# Patient Record
Sex: Female | Born: 1960
Health system: Southern US, Community
[De-identification: ages and names within clinical notes are randomized; demographics above are authoritative.]

## PROBLEM LIST (undated history)

## (undated) HISTORY — PX: ABDOMINAL HYSTERECTOMY: SHX81

---

## 2000-11-20 ENCOUNTER — Encounter (INDEPENDENT_AMBULATORY_CARE_PROVIDER_SITE_OTHER): Payer: Self-pay | Admitting: Specialist

## 2000-11-20 ENCOUNTER — Ambulatory Visit (HOSPITAL_COMMUNITY): Admission: AD | Admit: 2000-11-20 | Discharge: 2000-11-20 | Payer: Self-pay | Admitting: Obstetrics & Gynecology

## 2002-08-19 ENCOUNTER — Inpatient Hospital Stay (HOSPITAL_COMMUNITY): Admission: AD | Admit: 2002-08-19 | Discharge: 2002-08-19 | Payer: Self-pay | Admitting: Obstetrics and Gynecology

## 2003-01-04 ENCOUNTER — Inpatient Hospital Stay (HOSPITAL_COMMUNITY): Admission: AD | Admit: 2003-01-04 | Discharge: 2003-01-08 | Payer: Self-pay | Admitting: Obstetrics & Gynecology

## 2003-01-05 ENCOUNTER — Encounter (INDEPENDENT_AMBULATORY_CARE_PROVIDER_SITE_OTHER): Payer: Self-pay | Admitting: Specialist

## 2003-01-29 ENCOUNTER — Other Ambulatory Visit: Admission: RE | Admit: 2003-01-29 | Discharge: 2003-01-29 | Payer: Self-pay | Admitting: Obstetrics & Gynecology

## 2003-08-05 ENCOUNTER — Emergency Department (HOSPITAL_COMMUNITY): Admission: EM | Admit: 2003-08-05 | Discharge: 2003-08-05 | Payer: Self-pay | Admitting: Emergency Medicine

## 2003-08-19 ENCOUNTER — Encounter: Admission: RE | Admit: 2003-08-19 | Discharge: 2003-10-15 | Payer: Self-pay | Admitting: General Practice

## 2004-07-24 ENCOUNTER — Emergency Department (HOSPITAL_COMMUNITY): Admission: EM | Admit: 2004-07-24 | Discharge: 2004-07-24 | Payer: Self-pay | Admitting: Family Medicine

## 2004-07-26 ENCOUNTER — Encounter: Admission: RE | Admit: 2004-07-26 | Discharge: 2004-07-26 | Payer: Self-pay | Admitting: Occupational Medicine

## 2004-10-28 ENCOUNTER — Other Ambulatory Visit: Admission: RE | Admit: 2004-10-28 | Discharge: 2004-10-28 | Payer: Self-pay | Admitting: *Deleted

## 2006-01-10 ENCOUNTER — Other Ambulatory Visit: Admission: RE | Admit: 2006-01-10 | Discharge: 2006-01-10 | Payer: Self-pay | Admitting: *Deleted

## 2008-02-27 ENCOUNTER — Encounter: Admission: RE | Admit: 2008-02-27 | Discharge: 2008-02-27 | Payer: Self-pay | Admitting: Obstetrics and Gynecology

## 2010-06-23 ENCOUNTER — Encounter
Admission: RE | Admit: 2010-06-23 | Discharge: 2010-08-03 | Payer: Self-pay | Source: Home / Self Care | Admitting: General Practice

## 2010-10-03 ENCOUNTER — Encounter: Payer: Self-pay | Admitting: Obstetrics and Gynecology

## 2011-01-04 ENCOUNTER — Other Ambulatory Visit: Payer: Self-pay | Admitting: Obstetrics and Gynecology

## 2011-01-04 DIAGNOSIS — Z1231 Encounter for screening mammogram for malignant neoplasm of breast: Secondary | ICD-10-CM

## 2011-01-13 ENCOUNTER — Ambulatory Visit (HOSPITAL_COMMUNITY)
Admission: RE | Admit: 2011-01-13 | Discharge: 2011-01-13 | Disposition: A | Discharge status: Home / Self Care | Payer: Managed Care, Other (non HMO) | Source: Ambulatory Visit | Attending: Obstetrics and Gynecology | Admitting: Obstetrics and Gynecology

## 2011-01-13 DIAGNOSIS — Z1231 Encounter for screening mammogram for malignant neoplasm of breast: Secondary | ICD-10-CM

## 2011-01-28 NOTE — Discharge Summary (Signed)
NAMEHAYLE, Kelly Sawyer NO.:  [id number]   MEDICAL RECORD NO.:  [id number]                   PATIENT TYPE:  INP   LOCATION:  9142                                 FACILITY:  [facility]   PHYSICIAN:  Randye Lobo, M.D.                DATE OF BIRTH:  October 20, 1960   DATE OF ADMISSION:  01/04/2003  DATE OF DISCHARGE:  01/08/2003                                 DISCHARGE SUMMARY   FINAL DIAGNOSES:  1. Intrauterine pregnancy at term.  2. Prolonged rupture of membranes.  3. Positive group B strep culture.  4. Nonreassuring fetal heart tracing.   PROCEDURE:  Primary low transverse cesarean section.   SURGEON:  Gerrit Friends. Aldona Bar, M.D.   COMPLICATIONS:  None.   HOSPITAL COURSE:  This 50 year old G6 P2 presented on the evening of January 04, 2003 with ruptured membranes with what sounds like had occurred about 17  hours ago.  The patient was having minimal contractions.  Her antepartum  course had been complicated by advanced maternal age.  She did decline  amniocentesis.  The patient also had a positive group B strep culture  performed in the office.  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  The patient of course was admitted at this time.  Her  cervix was about 1 cm dilated at a -3 station.  She was begun on IV Unasyn  and induction was begun.  The patient dilated to about 4 cm rapidly but then  for over the next five to six hours only progressed to about 7 cm and -2  station.  Fetal heart tracing became nonreassuring and the patient was  placed on oxygen and position change.  Because of this nonreassuring tracing  and prolonged rupture of membranes the patient was taken to the operating  room for a cesarean section.   The patient was taken to the operating room by Dr. Annamaria Helling on January 05, 2003 where a primary low transverse cesarean section was performed  with the  delivery of a 6 pound 10 ounce female infant with Apgars of 9 and 9.  There  was a body cord noted.  The delivery went without complications.  The  patient's postoperative course was benign without any significant fevers.  The patient was felt ready for discharge on postoperative day #3.   DISPOSITION:  1. She was sent home on a regular diet.  2. Told to decrease activity.  3. Given a prescription for Percocet one to two q.4h. as needed for pain.  4. Told she could use ibuprofen if needed.  5.     Told to continue her prenatal vitamins and iron sulfate 325 mg one daily.  6. Follow up in the office in four weeks.  LABORATORY DATA ON DISCHARGE:  Hemoglobin 10.3, white blood cell count 8.6.     Leilani Able, P.A.-C.                Randye Lobo, M.D.    MB/MEDQ  D:  02/06/2003  T:  02/06/2003  Job:  [id number]

## 2011-01-28 NOTE — Op Note (Signed)
Kelly Sawyer, Kelly Sawyer                              ACCOUNT NO.:  [id number]   MEDICAL RECORD NO.:  [id number]                   PATIENT TYPE:  INP   LOCATION:  9142                                 FACILITY:  [facility]   PHYSICIAN:  Gerrit Friends. Aldona Bar, M.D.                DATE OF BIRTH:  [birth year]   DATE OF PROCEDURE:  01/05/2003  DATE OF DISCHARGE:                                 OPERATIVE REPORT   PREOPERATIVE DIAGNOSES:  1. Term pregnancy.  2. Prolonged rupture of membranes.  3. Positive group B strep.  4. Nonreassuring fetal heart tracing.   POSTOPERATIVE DIAGNOSES:  1. Term pregnancy.  2. Prolonged rupture of membranes.  3. Positive group B strep.  4. Nonreassuring fetal heart tracing.  5. Delivery of 6 pound 10 ounce female infant, Apgars 9 and 9 and body cord.   PROCEDURE:  Primary low transverse cesarean section.   SURGEON:  Gerrit Friends. Aldona Bar, M.D.   ANESTHESIA:  Epidural.   INDICATIONS:  This 50 year old gravida 6, para 2 presented at approximately  9:30 p.m. on the evening of January 04, 2003, having called me at 7 p.m.  relating what sounded like ruptured membranes.  Her history revealed that  she woke up at 5 a.m. on January 03, 2002, with clear fluid leaking from the  vagina.  At the time of admission, she had ruptured membranes for 17 hours.  She was having minimal contractions.  She did have a positive group B strep  antenatally.  At the time of admission, her cervix was 1 cm dilated, very  thick with the vertex at -3 station.  She was admitted, begun on IV Unasyn,  and induction was begun.  During the night, she had some progression;  initially she progressed rapidly to 4 cm, but thereafter the course of the  next 5-6 hours at best progressed only to 7 with the vertex remaining at -2  station.  Fetal heart tracing became Nonreassuring with the emergence of  decelerations that were initially thought to be variables and more recently  possible variables with late components.  She  was placed on oxygen,  positioned with really no essential improvement and her Pitocin which was  being used for augmentation had been turned down because of the possible  decelerations.  Because of the Nonreassuring tracing, she is now taken to  the operating room for cesarean section for delivery.   DESCRIPTION OF PROCEDURE:  The patient was taken to the operating room where  after the satisfactory augmentation of her epidural, she was prepped and  draped.  A Foley catheter had been inserted prior in the labor room.   Once good anesthetic levels were documented, the procedure was begun.   A Pfannenstiel incision was made with minimal difficulty, and dissected down  to and through the fascia in a low transverse fashion.  Subfascial space was  created  inferiorly and superiorly and muscles separated in the midline.  The  peritoneum was identified and entered appropriately with care taken to avoid  the bowel superiorly and the bladder inferiorly.  At this time, the  vesicouterine peritoneum was incised in a low transverse fashion and pushed  off the lower uterine segment with ease.  Sharp incision to the uterus with  the Metzenbaum scissors was then carried out.  Incision was extended  laterally and thereafter with minimal difficulty a viable female infant who  cried spontaneously was at once was delivered from the vertex position.  There was a body cord.   Once the infant was delivered, the cord was clamped and cut and the infant  was passed off to the awaiting team. Apgars were noted to be 9 and 9.  The  weight was found to be 6 pounds 10 ounces.  The baby was taken to the  nursery in good condition.   After the cord bloods were collected, the placenta was delivered intact.  The placenta was sent to pathology and labeled prolonged ruptured membranes.   At this time, the uterus was exteriorized and rendered free of any remaining  products of conception.  Good uterine contractility was  afforded with slowly  given intravenous Pitocin and manual stimulation.  Closure of the uterine  incision was then carried out at this time with suture of #1 Vicryl in a  running locking fashion with several figure-of-eight #1 Vicryl oversewn for  hemostasis.  At this time, the incision was noted to be dry.  The uterus was  well contracted.  Tubes and ovaries appeared normal. The abdomen was lavaged  of all free blood and clot.  The uterus was placed in the abdominal cavity  and after all counts were rendered to be correct and no foreign bodies were  found to be remaining in the abdominal cavity, closure of the abdomen was  begun in layers.  The abdominal peritoneum was closed with 0 Vicryl in a  running fashion.  Muscles were repaired with same.   Assured of good fascial hemostasis, the fascia was then reapproximated using  0 Vicryl from angle to midline bilaterally.  Subcutaneous tissues were then  rendered hemostatic, and staples were then used to close the skin.  A  sterile pressure dressing was applied.  At this time, the transported to the  recovery room in satisfactory condition having tolerated the procedure well.  Estimated blood loss 500 cc.  All counts were correct x2.   SUMMARY:  In summary, this patient presented with prolonged rupture of  membranes with positive group B strep test antenatally and had some  progression as the night progressed but unfortunately developed a  Nonreassuring tracing and was taken to the operating room for primary low  transverse cesarean section.  She did receive 2 doses of Unasyn prior to  delivery.  In the operating room, she was delivered of a 6 pound 10 ounce  female infant with Apgars of 9 and 9 and a body cord was noted.  At the  conclusion of the procedure, both mother and baby were doing well in their  respective recovery areas.                                               Gerrit Friends. Aldona Bar, M.D.   RMW/MEDQ  D:  01/05/2003  T:   01/05/2003  Job:  [id number]

## 2011-01-28 NOTE — Op Note (Signed)
[facility]  Patient:    Kelly Sawyer, Kelly Sawyer Visit Number: [id number] MRN: [id number]          Service Type: OBS Location: [facility] Attending Physician:  Lars Pinks Proc. Date: 11/20/00 Admit Date:  11/20/2000                             Operative Report  PREOPERATIVE DIAGNOSIS:       Incomplete abortion.  POSTOPERATIVE DIAGNOSIS:      Incomplete abortion.  PROCEDURE:                    Dilatation and evacuation.  SURGEON:                      Richard D. Arlyce Dice, M.D.  ANESTHESIA:                   Paracervical block with IV sedation.  ESTIMATED BLOOD LOSS:         Approximately 200-300 cc from the time the patient presented to the hospital to the time the D&E was performed.  COMPLICATIONS:                None.  FINDINGS:                     The patient passed a 13 week fetus in the emergency room.  She then had the placenta removed with a ring forceps in the operating room.  INDICATIONS:                  This is a 50 year old gravida 5, para 2-1-1-2 who was brought to the hospital by ambulance with heavy vaginal bleeding. On evaluation in the emergency room, a pessary which had been placed in the office was removed and a 13 week fetus was noted to be in the vagina.  THis was also removed.  The patient had significant hemorrhage and, because of hospital regulation, no attempt at placental removal could be made in the emergency room.  The patient was therefore transported to the operating room, for the D&E procedure.  DESCRIPTION OF PROCEDURE:     The patient was taken to the operating room and placed in the dorsal lithotomy position.  Intravenous sedation was administered.  The anterior lip of the cervix was grasped with a single tooth tenaculum and the paracervical tissues were infiltrated with 1% lidocaine, 20 cc.  Ring forceps were introduced.  The placenta was grasped and extracted through the widely dilated cervix.  A #12 suction  curet was introduced and the endometrium was systematically curetted until no further tissue was noted to be in situ.  The procedure was then terminated and the patient left the operating room in good condition. Attending Physician:  Lars Pinks DD:  11/20/00 TD:  11/21/00 Job: [id number]

## 2012-04-02 ENCOUNTER — Emergency Department (HOSPITAL_COMMUNITY)
Admission: EM | Admit: 2012-04-02 | Discharge: 2012-04-02 | Disposition: A | Discharge status: Home / Self Care | Payer: Managed Care, Other (non HMO) | Attending: Emergency Medicine | Admitting: Emergency Medicine

## 2012-04-02 ENCOUNTER — Emergency Department (HOSPITAL_COMMUNITY): Payer: Managed Care, Other (non HMO)

## 2012-04-02 ENCOUNTER — Encounter (HOSPITAL_COMMUNITY): Payer: Self-pay | Admitting: *Deleted

## 2012-04-02 DIAGNOSIS — R51 Headache: Secondary | ICD-10-CM | POA: Insufficient documentation

## 2012-04-02 DIAGNOSIS — R141 Gas pain: Secondary | ICD-10-CM

## 2012-04-02 DIAGNOSIS — R110 Nausea: Secondary | ICD-10-CM | POA: Insufficient documentation

## 2012-04-02 DIAGNOSIS — R142 Eructation: Secondary | ICD-10-CM | POA: Insufficient documentation

## 2012-04-02 DIAGNOSIS — R140 Abdominal distension (gaseous): Secondary | ICD-10-CM

## 2012-04-02 LAB — COMPREHENSIVE METABOLIC PANEL
ALT: 25 U/L (ref 0–35)
AST: 33 U/L (ref 0–37)
Albumin: 4.4 g/dL (ref 3.5–5.2)
Alkaline Phosphatase: 59 U/L (ref 39–117)
BUN: 11 mg/dL (ref 6–23)
CO2: 28 mEq/L (ref 19–32)
Calcium: 9.6 mg/dL (ref 8.4–10.5)
Chloride: 104 mEq/L (ref 96–112)
Creatinine, Ser: 0.86 mg/dL (ref 0.50–1.10)
GFR calc Af Amer: 90 mL/min — ABNORMAL LOW (ref >90)
GFR calc non Af Amer: 77 mL/min — ABNORMAL LOW (ref >90)
Glucose, Bld: 98 mg/dL (ref 70–99)
Potassium: 4.2 mEq/L (ref 3.5–5.1)
Sodium: 141 mEq/L (ref 135–145)
Total Bilirubin: 0.3 mg/dL (ref 0.3–1.2)
Total Protein: 7.7 g/dL (ref 6.0–8.3)

## 2012-04-02 LAB — OCCULT BLOOD, POC DEVICE: Fecal Occult Bld: NEGATIVE

## 2012-04-02 LAB — URINALYSIS, ROUTINE W REFLEX MICROSCOPIC
Bilirubin Urine: NEGATIVE
Glucose, UA: NEGATIVE mg/dL
Hgb urine dipstick: NEGATIVE
Ketones, ur: NEGATIVE mg/dL
Nitrite: NEGATIVE
Protein, ur: NEGATIVE mg/dL
Specific Gravity, Urine: 1.021 (ref 1.005–1.030)
Urobilinogen, UA: 0.2 mg/dL (ref 0.0–1.0)
pH: 8 (ref 5.0–8.0)

## 2012-04-02 LAB — URINE MICROSCOPIC-ADD ON

## 2012-04-02 LAB — CBC WITH DIFFERENTIAL/PLATELET
Basophils Absolute: 0 10*3/uL (ref 0.0–0.1)
Basophils Relative: 0 % (ref 0–1)
Eosinophils Absolute: 0.1 10*3/uL (ref 0.0–0.7)
Eosinophils Relative: 2 % (ref 0–5)
HCT: 36.9 % (ref 36.0–46.0)
Hemoglobin: 12.4 g/dL (ref 12.0–15.0)
Lymphocytes Relative: 36 % (ref 12–46)
Lymphs Abs: 1.7 10*3/uL (ref 0.7–4.0)
MCH: 30.7 pg (ref 26.0–34.0)
MCHC: 33.6 g/dL (ref 30.0–36.0)
MCV: 91.3 fL (ref 78.0–100.0)
Monocytes Absolute: 0.3 10*3/uL (ref 0.1–1.0)
Monocytes Relative: 7 % (ref 3–12)
Neutro Abs: 2.6 10*3/uL (ref 1.7–7.7)
Neutrophils Relative %: 56 % (ref 43–77)
Platelets: 243 10*3/uL (ref 150–400)
RBC: 4.04 MIL/uL (ref 3.87–5.11)
RDW: 13.1 % (ref 11.5–15.5)
WBC: 4.8 10*3/uL (ref 4.0–10.5)

## 2012-04-02 MED ORDER — DICYCLOMINE HCL 10 MG PO CAPS
10.0000 mg | ORAL_CAPSULE | Freq: Once | ORAL | Status: AC
  Administered 2012-04-02: 10 mg via ORAL
  Filled 2012-04-02: qty 1

## 2012-04-02 MED ORDER — SIMETHICONE 40 MG/0.6ML PO SUSP: 40.0000 mg | Freq: Four times a day (QID) | ORAL | Status: DC | PRN

## 2012-04-02 MED ORDER — ALUM & MAG HYDROXIDE-SIMETH 200-200-20 MG/5ML PO SUSP
30.0000 mL | Freq: Once | ORAL | Status: AC
  Administered 2012-04-02: 30 mL via ORAL
  Filled 2012-04-02: qty 30

## 2012-04-02 NOTE — ED Provider Notes (Signed)
History     CSN: [id number]  Arrival date & time 04/02/12  1807   First MD Initiated Contact with Patient 04/02/12 2030      Chief Complaint  Patient presents with  . Abdominal Pain    (Consider location/radiation/quality/duration/timing/severity/associated sxs/prior treatment) Patient is a 51 y.o. female presenting with abdominal pain. The history is provided by the patient.  Abdominal Pain The primary symptoms of the illness include abdominal pain and nausea. The primary symptoms of the illness do not include fever, fatigue, shortness of breath, vomiting, diarrhea, hematemesis, hematochezia, dysuria or vaginal discharge. Episode onset: one week ago. The onset of the illness was gradual. The problem has not changed since onset. The abdominal pain is generalized. The abdominal pain does not radiate. Relieved by: nothing. Exacerbated by: nothing.  Additional symptoms associated with the illness include constipation. Symptoms associated with the illness do not include chills, diaphoresis, hematuria or back pain. Associated medical issues comments: none.    History reviewed. No pertinent past medical history.  History reviewed. No pertinent past surgical history.  History reviewed. No pertinent family history.  History  Substance Use Topics  . Smoking status: Never Smoker   . Smokeless tobacco: Not on file  . Alcohol Use: No    OB History    Grav Para Term Preterm Abortions TAB SAB Ect Mult Living                  Review of Systems  Constitutional: Negative for fever, chills, diaphoresis and fatigue.  HENT: Negative for ear pain, congestion, sore throat, facial swelling, mouth sores, trouble swallowing, neck pain and neck stiffness.   Eyes: Negative.   Respiratory: Negative for apnea, cough, chest tightness, shortness of breath and wheezing.   Cardiovascular: Negative for chest pain, palpitations and leg swelling.  Gastrointestinal: Positive for nausea, abdominal pain and  constipation. Negative for vomiting, diarrhea, hematochezia, abdominal distention, anal bleeding and hematemesis.  Genitourinary: Negative for dysuria, hematuria, flank pain, vaginal discharge, difficulty urinating and menstrual problem.  Musculoskeletal: Negative for back pain and gait problem.  Skin: Negative for rash and wound.  Neurological: Positive for headaches. Negative for dizziness, tremors, seizures, syncope, facial asymmetry and numbness.  Psychiatric/Behavioral: Negative.   All other systems reviewed and are negative.    Allergies  Review of patient's allergies indicates no known allergies.  Home Medications   Current Outpatient Rx  Name Route Sig Dispense Refill  . ESOMEPRAZOLE MAGNESIUM 40 MG PO CPDR Oral Take 40 mg by mouth daily before breakfast.    . MAGNESIUM HYDROXIDE 400 MG/5ML PO SUSP Oral Take 30 mLs by mouth daily as needed. For stomach pain    . NAPROXEN SODIUM 220 MG PO TABS Oral Take 440 mg by mouth daily as needed. For pain    . SIMETHICONE 40 MG/0.6ML PO SUSP Oral Take 0.6 mLs (40 mg total) by mouth 4 (four) times daily as needed. 30 mL 0    BP 112/53  Pulse 68  Temp 99.1 F (37.3 C)  Resp 20  SpO2 98%  Physical Exam  Nursing note and vitals reviewed. Constitutional: She is oriented to person, place, and time. She appears well-developed and well-nourished. No distress.  HENT:  Head: Normocephalic and atraumatic.  Right Ear: External ear normal.  Left Ear: External ear normal.  Nose: Nose normal.  Mouth/Throat: Oropharynx is clear and moist. No oropharyngeal exudate.  Eyes: Conjunctivae and EOM are normal. Pupils are equal, round, and reactive to light. Right eye exhibits no  discharge. Left eye exhibits no discharge.  Neck: Normal range of motion. Neck supple. No JVD present. No tracheal deviation present. No thyromegaly present.  Cardiovascular: Normal rate, regular rhythm, normal heart sounds and intact distal pulses.  Exam reveals no gallop and  no friction rub.   No murmur heard. Pulmonary/Chest: Effort normal and breath sounds normal. No respiratory distress. She has no wheezes. She has no rales. She exhibits no tenderness.  Abdominal: Soft. Bowel sounds are normal. She exhibits no distension. There is tenderness (diffuse mild abdominal discomfort). There is no rebound and no guarding.  Genitourinary: Rectum normal. Rectal exam shows no external hemorrhoid, no internal hemorrhoid, no fissure, no mass, no tenderness and anal tone normal. Guaiac negative stool.  Musculoskeletal: Normal range of motion.  Lymphadenopathy:    She has no cervical adenopathy.  Neurological: She is alert and oriented to person, place, and time. No cranial nerve deficit. Coordination normal.  Skin: Skin is warm. No rash noted. She is not diaphoretic.  Psychiatric: She has a normal mood and affect. Her behavior is normal. Judgment and thought content normal.    ED Course  Procedures (including critical care time)  Labs Reviewed  URINALYSIS, ROUTINE W REFLEX MICROSCOPIC - Abnormal; Notable for the following:    APPearance CLOUDY (*)     Leukocytes, UA TRACE (*)     All other components within normal limits  COMPREHENSIVE METABOLIC PANEL - Abnormal; Notable for the following:    GFR calc non Af Amer 77 (*)     GFR calc Af Amer 90 (*)     All other components within normal limits  URINE MICROSCOPIC-ADD ON - Abnormal; Notable for the following:    Squamous Epithelial / LPF MANY (*)     All other components within normal limits  CBC WITH DIFFERENTIAL  OCCULT BLOOD, POC DEVICE  OCCULT BLOOD X 1 CARD TO LAB, STOOL   Dg Abd 1 View  04/02/2012  *RADIOLOGY REPORT*  Clinical Data: Abdominal pain  ABDOMEN - 1 VIEW  Comparison: CT 10/15/2007  Findings: Stool is present in the right colon.  Negative for bowel obstruction.  No kidney stones.  No acute bony abnormality.  IMPRESSION: No acute abnormality.  Original Report Authenticated By: Camelia Phenes, M.D.      1. Abdominal bloating   2. Gas pain       MDM  51 year old female patient with noncontributory past medical history presents with abdominal bloating sensation and discomfort. Patient noted that she had headaches with nurse but did not say that she's been having headaches when I asked her. She says that she sometimes has headaches but is not currently having one. Patient says her abdominal symptoms started one week ago and has been feeling nauseated with an intermittently. She says she has diffuse abdominal discomfort that migrates and feels like gas is moving in her stomach. Patient says she felt she was constipated took some milk of magnesia and had a normal bowel movement today. Patient has no pain with eating drinking. Nothing makes the pain better or worse according to the patient. Patient with normal rectal exam nonfocal abdominal exam was soft abdomen with deep palpation. Patient without vaginal bleeding vaginal discharge or pelvic pain. No dysuria. Labs are normal no evidence of ileus on x-ray. Patient without focal concerning abdominal pain, I suspect that she may have pain related to gas. Gave patient simethicone and Maalox with resolution of symptoms. Wrote prescription for simethicone and we'll have patient followup with PCP.  Results for orders placed during the hospital encounter of 04/02/12  URINALYSIS, ROUTINE W REFLEX MICROSCOPIC      Component Value Range   Color, Urine YELLOW  YELLOW   APPearance CLOUDY (*) CLEAR   Specific Gravity, Urine 1.021  1.005 - 1.030   pH 8.0  5.0 - 8.0   Glucose, UA NEGATIVE  NEGATIVE mg/dL   Hgb urine dipstick NEGATIVE  NEGATIVE   Bilirubin Urine NEGATIVE  NEGATIVE   Ketones, ur NEGATIVE  NEGATIVE mg/dL   Protein, ur NEGATIVE  NEGATIVE mg/dL   Urobilinogen, UA 0.2  0.0 - 1.0 mg/dL   Nitrite NEGATIVE  NEGATIVE   Leukocytes, UA TRACE (*) NEGATIVE  CBC WITH DIFFERENTIAL      Component Value Range   WBC 4.8  4.0 - 10.5 K/uL   RBC 4.04  3.87  - 5.11 MIL/uL   Hemoglobin 12.4  12.0 - 15.0 g/dL   HCT 16.1  09.6 - 04.5 %   MCV 91.3  78.0 - 100.0 fL   MCH 30.7  26.0 - 34.0 pg   MCHC 33.6  30.0 - 36.0 g/dL   RDW 40.9  81.1 - 91.4 %   Platelets 243  150 - 400 K/uL   Neutrophils Relative 56  43 - 77 %   Neutro Abs 2.6  1.7 - 7.7 K/uL   Lymphocytes Relative 36  12 - 46 %   Lymphs Abs 1.7  0.7 - 4.0 K/uL   Monocytes Relative 7  3 - 12 %   Monocytes Absolute 0.3  0.1 - 1.0 K/uL   Eosinophils Relative 2  0 - 5 %   Eosinophils Absolute 0.1  0.0 - 0.7 K/uL   Basophils Relative 0  0 - 1 %   Basophils Absolute 0.0  0.0 - 0.1 K/uL  COMPREHENSIVE METABOLIC PANEL      Component Value Range   Sodium 141  135 - 145 mEq/L   Potassium 4.2  3.5 - 5.1 mEq/L   Chloride 104  96 - 112 mEq/L   CO2 28  19 - 32 mEq/L   Glucose, Bld 98  70 - 99 mg/dL   BUN 11  6 - 23 mg/dL   Creatinine, Ser 7.82  0.50 - 1.10 mg/dL   Calcium 9.6  8.4 - 95.6 mg/dL   Total Protein 7.7  6.0 - 8.3 g/dL   Albumin 4.4  3.5 - 5.2 g/dL   AST 33  0 - 37 U/L   ALT 25  0 - 35 U/L   Alkaline Phosphatase 59  39 - 117 U/L   Total Bilirubin 0.3  0.3 - 1.2 mg/dL   GFR calc non Af Amer 77 (*) >90 mL/min   GFR calc Af Amer 90 (*) >90 mL/min  URINE MICROSCOPIC-ADD ON      Component Value Range   Squamous Epithelial / LPF MANY (*) RARE   WBC, UA 0-2  <3 WBC/hpf   RBC / HPF 0-2  <3 RBC/hpf   Urine-Other AMORPHOUS URATES/PHOSPHATES    OCCULT BLOOD, POC DEVICE      Component Value Range   Fecal Occult Bld NEGATIVE      DG Abd 1 View (Final result)   Result time:04/02/12 2147    Final result by Rad Results In Interface (04/02/12 21:47:56)    Narrative:   *RADIOLOGY REPORT*  Clinical Data: Abdominal pain  ABDOMEN - 1 VIEW  Comparison: CT 10/15/2007  Findings: Stool is present in the right colon. Negative for  bowel obstruction. No kidney stones. No acute bony abnormality.  IMPRESSION: No acute abnormality.  Original Report Authenticated By: Camelia Phenes, M.D.      Case discussed with Dr. Sharlot Gowda, MD 04/02/12 [phone]

## 2012-04-02 NOTE — ED Notes (Signed)
The pt ha shad abd pain for one week with nausea and vomiting headache and bloating.  lmp none

## 2012-04-02 NOTE — ED Notes (Signed)
Patient is resting comfortably. 

## 2012-04-06 NOTE — ED Provider Notes (Signed)
I saw and evaluated the patient, reviewed the resident's note and I agree with the findings and plan.  50yF with abdominal pain. Mi;d diffuse tenderness on my exam without rebound or guarding. No distension. W/u reassuring. Doubt obstruction and XR not suggestive either. Very low suspicion for acute surgical abdomen. Plan symptomatic tx. Return precautions discussed. Outtpt fu.  Raeford Razor, MD 04/06/12 [phone]

## 2012-06-24 ENCOUNTER — Emergency Department (HOSPITAL_BASED_OUTPATIENT_CLINIC_OR_DEPARTMENT_OTHER): Payer: Managed Care, Other (non HMO)

## 2012-06-24 ENCOUNTER — Encounter (HOSPITAL_BASED_OUTPATIENT_CLINIC_OR_DEPARTMENT_OTHER): Payer: Self-pay | Admitting: *Deleted

## 2012-06-24 ENCOUNTER — Emergency Department (HOSPITAL_BASED_OUTPATIENT_CLINIC_OR_DEPARTMENT_OTHER)
Admission: EM | Admit: 2012-06-24 | Discharge: 2012-06-25 | Disposition: A | Discharge status: Home / Self Care | Payer: Managed Care, Other (non HMO) | Attending: Emergency Medicine | Admitting: Emergency Medicine

## 2012-06-24 DIAGNOSIS — Z79899 Other long term (current) drug therapy: Secondary | ICD-10-CM | POA: Insufficient documentation

## 2012-06-24 DIAGNOSIS — R079 Chest pain, unspecified: Secondary | ICD-10-CM

## 2012-06-24 DIAGNOSIS — R51 Headache: Secondary | ICD-10-CM

## 2012-06-24 DIAGNOSIS — R0602 Shortness of breath: Secondary | ICD-10-CM | POA: Insufficient documentation

## 2012-06-24 LAB — URINALYSIS, ROUTINE W REFLEX MICROSCOPIC
Glucose, UA: NEGATIVE mg/dL
Hgb urine dipstick: NEGATIVE
Ketones, ur: NEGATIVE mg/dL
Protein, ur: NEGATIVE mg/dL
pH: 7 (ref 5.0–8.0)

## 2012-06-24 MED ORDER — SODIUM CHLORIDE 0.9 % IV BOLUS (SEPSIS)
1000.0000 mL | Freq: Once | INTRAVENOUS | Status: AC
  Administered 2012-06-24: 1000 mL via INTRAVENOUS

## 2012-06-24 NOTE — ED Provider Notes (Signed)
History     CSN: [id number]  Arrival date & time 06/24/12  2200   First MD Initiated Contact with Patient 06/24/12 2301      Chief Complaint  Patient presents with  . Chest Pain     HPI  The patient p/w HA and CP.  She was in her [facility] prior to ~5d pta, and has no Hx of headaches, nor any similar episodes to that which brings her to the ED tonight. For the past 5d she has had intermittent CP (sternal / l-sided, non-radiating, non-exertional, non-pleuritic, sharp).  There are no clear alleviating / exacerbating /precipitating factors.  No associated dyspnea, n/v/d. For the past 2d she has been developing a HA. There is a sense of pressure in the retro-orbital area, with photophobia, but no acuity changes.  She denies confusion / disorientation, but does endorse feeling generally unwell.  She has new insomnia as well, 2/2 the pain.  History reviewed. No pertinent past medical history.  Past Surgical History  Procedure Date  . Abdominal hysterectomy     No family history on file.  History  Substance Use Topics  . Smoking status: Never Smoker   . Smokeless tobacco: Not on file  . Alcohol Use: No    OB History    Grav Para Term Preterm Abortions TAB SAB Ect Mult Living                  Review of Systems  Constitutional:       HPI  HENT:       HPI otherwise negative  Eyes: Negative.   Respiratory:       HPI, otherwise negative  Cardiovascular:       HPI, otherwise nmegative  Gastrointestinal: Negative for vomiting.  Genitourinary:       HPI, otherwise negative  Musculoskeletal:       HPI, otherwise negative  Skin: Negative.   Neurological: Positive for headaches. Negative for syncope.    Allergies  Review of patient's allergies indicates no known allergies.  Home Medications   Current Outpatient Rx  Name Route Sig Dispense Refill  . TRAZODONE HCL 100 MG PO TABS Oral Take 100 mg by mouth as needed.    Marland Kitchen ESOMEPRAZOLE MAGNESIUM 40 MG PO CPDR Oral Take 40 mg  by mouth daily before breakfast.      BP 113/69  Pulse 62  Temp 98.1 F (36.7 C) (Oral)  Resp 16  Ht 5\' 1"  (1.549 m)  Wt 145 lb (65.772 kg)  BMI 27.40 kg/m2  SpO2 100%  Physical Exam  Nursing note and vitals reviewed. Constitutional: She is oriented to person, place, and time. She appears well-developed and well-nourished. No distress.  HENT:  Head: Normocephalic and atraumatic.  Eyes: Conjunctivae normal and EOM are normal. Right eye exhibits no discharge. Left eye exhibits no discharge.       Patient will not fully open her eyes, but pupils are symmetric.  Trace horizontal nystagmus, no vertical.  EOMI  Cardiovascular: Normal rate and regular rhythm.   Pulmonary/Chest: Effort normal and breath sounds normal. No stridor. No respiratory distress.  Abdominal: She exhibits no distension.  Musculoskeletal: She exhibits no edema.  Neurological: She is alert and oriented to person, place, and time. No cranial nerve deficit. Coordination normal.       Seemingly symmetric strength and coordination, but slowed responses, and slow to follow commands.  Skin: Skin is warm and dry.  Psychiatric: She has a normal mood and affect. Judgment  and thought content normal. Her speech is delayed. She is withdrawn. Cognition and memory are normal.    ED Course  Procedures (including critical care time)   Labs Reviewed  CBC WITH DIFFERENTIAL  COMPREHENSIVE METABOLIC PANEL  TROPONIN I  URINALYSIS, ROUTINE W REFLEX MICROSCOPIC   No results found.   No diagnosis found.  Cardiac: 61sr, normal  O2:99%ra, normal   Date: 06/24/2012  Rate: 59  Rhythm: sinus bradycardia  QRS Axis: normal  Intervals: normal  ST/T Wave abnormalities: normal  Conduction Disutrbances:none  Narrative Interpretation:   Old EKG Reviewed: none available BORDERLINE  12:42 AM Labs and imaging reviewed with the patient.  She reports continued retro-orbital discomfort.  On repeat exam there is trace bilateral  injected conjunctiva.  She will receive additional analgesics and be reassessed.  2:13 AM The patient was feeling significantly better.  On reexam she is resting comfortably, in no distress.  Vital signs remain unremarkable. MDM  This generally well-appearing female presents with concerns of headache and chest pain.  Given the patient's description of symptoms that progressed over the past days there suspicion of infectious etiology versus complex migraine, with low concern for ongoing acute ischemic cardiac issue.  The patient's relative comfort during my exam and on repeat exams is reassuring for this likelihood.  Absent focal neuro findings, there is low suspicion for bleed or CVA.  The patient's description of retro-orbital pain, but with appropriate extraocular eye motion, and visual acuity is suggestive of atypical migraine as well.  Absent fever, nausea, vomiting, overt signs of distress or leukocytosis infectious etiology is less likely.  The patient did have mild elevation in her creatinine.  Following IV fluids, analgesics the patient was significantly better.  I discussed all findings with the patient.  Also discussed the need for ongoing primary care evaluation with repeat blood draw.  She was discharged in stable condition.  Gerhard Munch, MD 06/25/12 [phone]

## 2012-06-24 NOTE — ED Notes (Addendum)
Pt. Has multiple complaints. States she is not able to open her eyes due to pain. Denies any injury.  On exam pt. Will not open eyes for assessment. Did attempt to look into bilateral eyes at pupils and sclera with mild redness to both eyes States pain started today. Denies any h/a  Also, c/o of ant chest pain that started yesterday. Describes as dull pain that comes and goes. C/o feeling sob as well. Rates current cp 6/10. Denies n/v or fevers. Denies any hx of cp. resp even and unlabored.

## 2012-06-24 NOTE — ED Notes (Signed)
Patient transported to X-ray 

## 2012-06-25 LAB — CBC WITH DIFFERENTIAL/PLATELET
Eosinophils Relative: 4 % (ref 0–5)
Lymphocytes Relative: 28 % (ref 12–46)
Monocytes Relative: 9 % (ref 3–12)
Neutrophils Relative %: 59 % (ref 43–77)
Platelets: UNDETERMINED 10*3/uL (ref 150–400)
RBC: 4.18 MIL/uL (ref 3.87–5.11)
WBC: 6.5 10*3/uL (ref 4.0–10.5)

## 2012-06-25 LAB — COMPREHENSIVE METABOLIC PANEL
ALT: 26 U/L (ref 0–35)
AST: 23 U/L (ref 0–37)
CO2: 27 mEq/L (ref 19–32)
Chloride: 102 mEq/L (ref 96–112)
GFR calc non Af Amer: 51 mL/min — ABNORMAL LOW (ref >90)
Sodium: 138 mEq/L (ref 135–145)
Total Bilirubin: 0.4 mg/dL (ref 0.3–1.2)

## 2012-06-25 MED ORDER — SODIUM CHLORIDE 0.9 % IV BOLUS (SEPSIS)
1000.0000 mL | Freq: Once | INTRAVENOUS | Status: AC
  Administered 2012-06-25: 1000 mL via INTRAVENOUS

## 2012-06-25 MED ORDER — ACETAMINOPHEN 325 MG PO TABS
ORAL_TABLET | ORAL | Status: AC
  Administered 2012-06-25: 650 mg via ORAL
  Filled 2012-06-25: qty 2

## 2012-06-25 MED ORDER — ACETAMINOPHEN 500 MG PO TABS: 500.0000 mg | ORAL_TABLET | Freq: Four times a day (QID) | ORAL | Status: AC | PRN

## 2012-06-25 MED ORDER — ACETAMINOPHEN 325 MG PO TABS
650.0000 mg | ORAL_TABLET | Freq: Once | ORAL | Status: AC
  Administered 2012-06-25: 650 mg via ORAL

## 2012-06-25 MED ORDER — METOCLOPRAMIDE HCL 5 MG/ML IJ SOLN
10.0000 mg | Freq: Once | INTRAMUSCULAR | Status: AC
  Administered 2012-06-25: 10 mg via INTRAVENOUS
  Filled 2012-06-25: qty 2

## 2012-06-25 MED ORDER — DIPHENHYDRAMINE HCL 50 MG/ML IJ SOLN
25.0000 mg | Freq: Once | INTRAMUSCULAR | Status: AC
  Administered 2012-06-25: 25 mg via INTRAVENOUS
  Filled 2012-06-25: qty 1

## 2012-06-25 NOTE — ED Notes (Signed)
rx x 1 given for tylenol- pt given work note- family called and will pick pt up

## 2012-09-24 ENCOUNTER — Ambulatory Visit: Payer: Self-pay

## 2012-09-24 ENCOUNTER — Ambulatory Visit
Admission: RE | Admit: 2012-09-24 | Discharge: 2012-09-24 | Disposition: A | Discharge status: Home / Self Care | Payer: Managed Care, Other (non HMO) | Source: Ambulatory Visit | Attending: General Practice | Admitting: General Practice

## 2012-09-24 ENCOUNTER — Other Ambulatory Visit: Payer: Self-pay | Admitting: General Practice

## 2012-09-24 DIAGNOSIS — T1490XA Injury, unspecified, initial encounter: Secondary | ICD-10-CM

## 2013-05-09 ENCOUNTER — Other Ambulatory Visit: Payer: Self-pay | Admitting: Internal Medicine

## 2013-05-09 DIAGNOSIS — Z1231 Encounter for screening mammogram for malignant neoplasm of breast: Secondary | ICD-10-CM

## 2013-05-10 ENCOUNTER — Ambulatory Visit
Admission: RE | Admit: 2013-05-10 | Discharge: 2013-05-10 | Disposition: A | Discharge status: Home / Self Care | Payer: Managed Care, Other (non HMO) | Source: Ambulatory Visit | Attending: Internal Medicine | Admitting: Internal Medicine

## 2013-05-10 DIAGNOSIS — Z1231 Encounter for screening mammogram for malignant neoplasm of breast: Secondary | ICD-10-CM

## 2013-09-27 IMAGING — CT CT HEAD W/O CM
2 series · 16 of 30 positions shown, 18 images · non-contrast
Comparison: 08/05/2003

CLINICAL DATA: Pain and pressure behind eyes.

CT HEAD WITHOUT CONTRAST
TECHNIQUE: Contiguous axial images were obtained from the base of
the skull through the vertex without contrast.

[Series 2: head 4.8 h37s · axial · 0.39mm/px · z∈[-190,-57]mm · 8 of 36 slices shown, 10 images]
[im 4/36  brain]
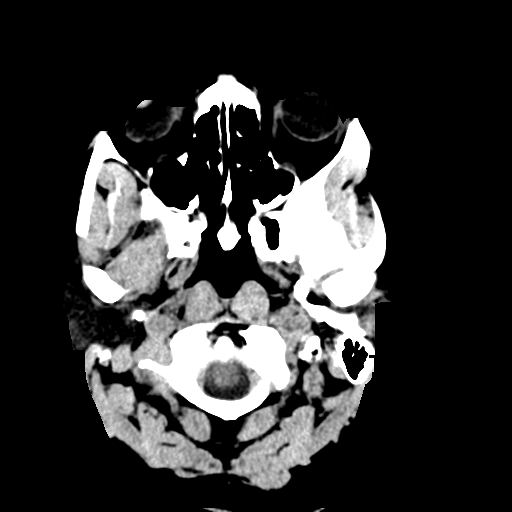
[im 4/36  bone]
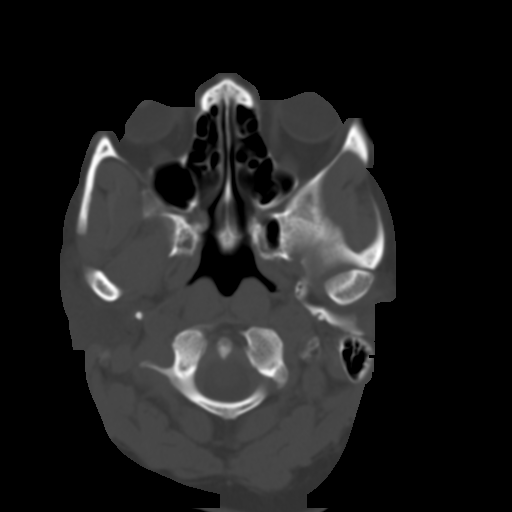
[im 8/36  brain]
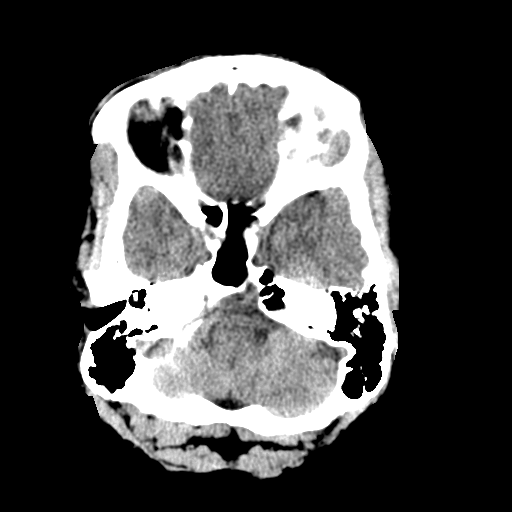
[im 12/36  brain]
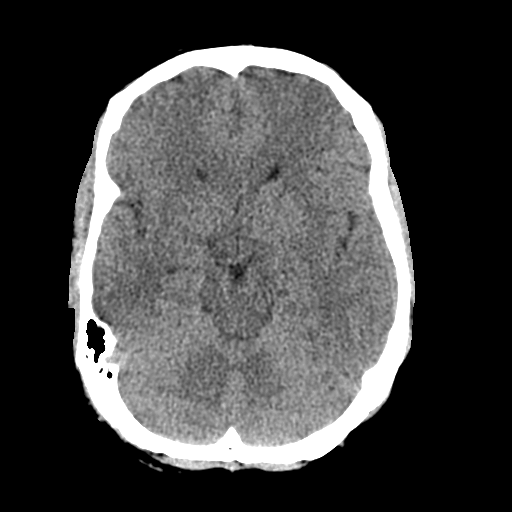
[im 16/36  brain]
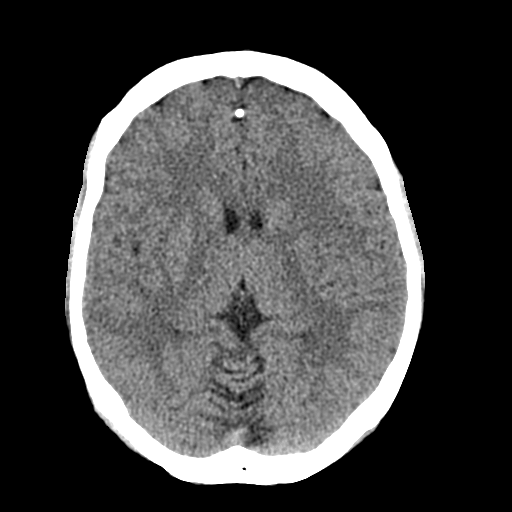
[im 20/36  brain]
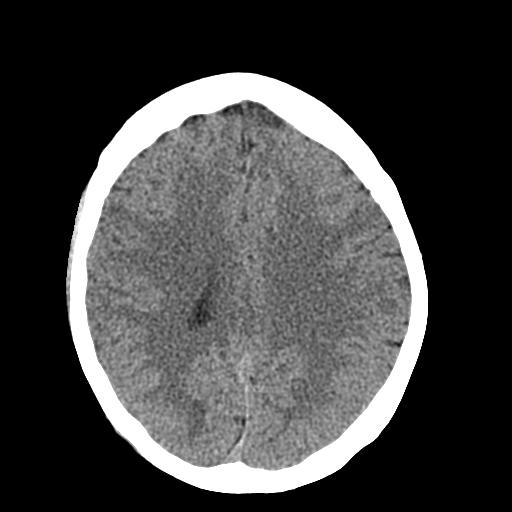
[im 20/36  bone]
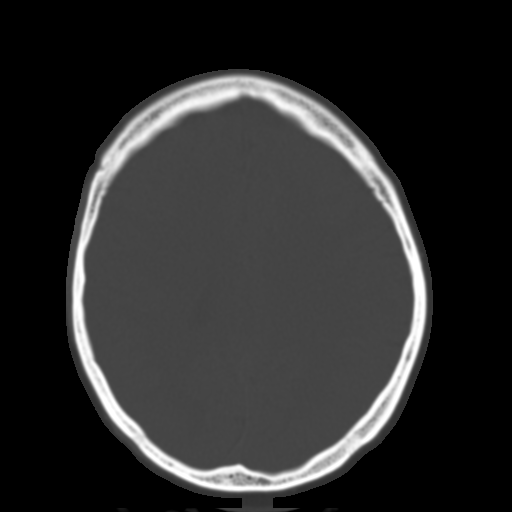
[im 24/36  brain]
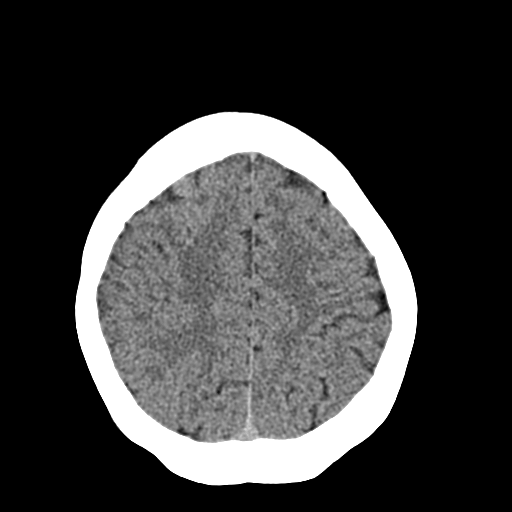
[im 28/36  brain]
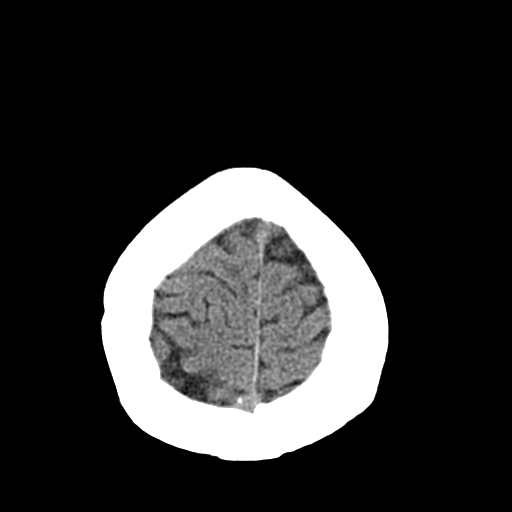
[im 32/36  brain]
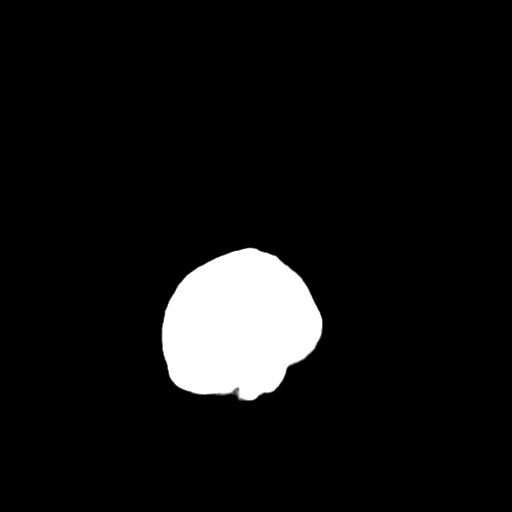

[Series 3: head 2.4 h60s bone · axial · 0.39mm/px · z∈[-189,-56]mm · 8 of 72 slices shown]
[im 8/72  bone]
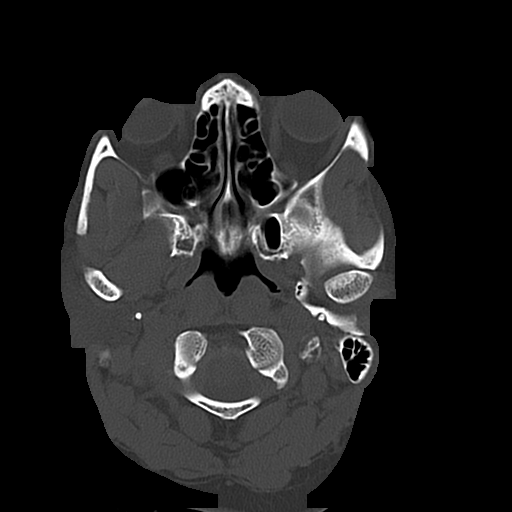
[im 15/72  bone]
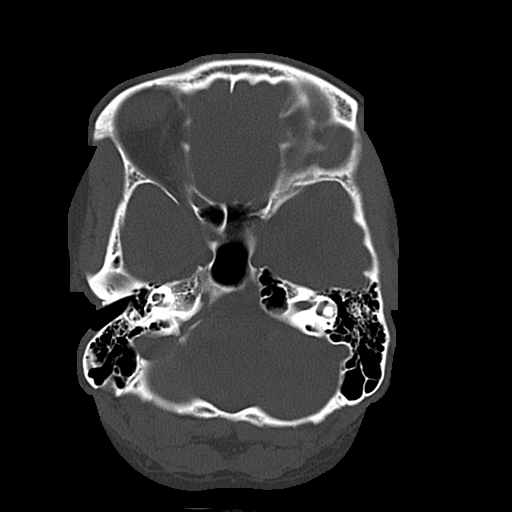
[im 23/72  bone]
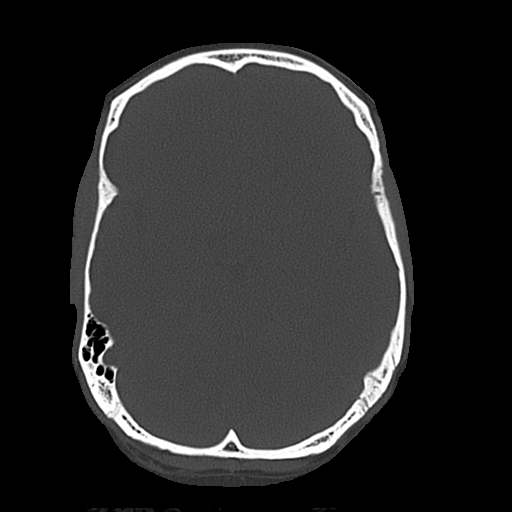
[im 30/72  bone]
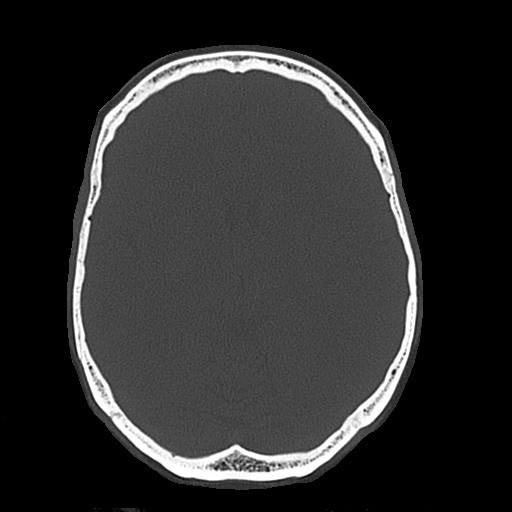
[im 42/72  bone]
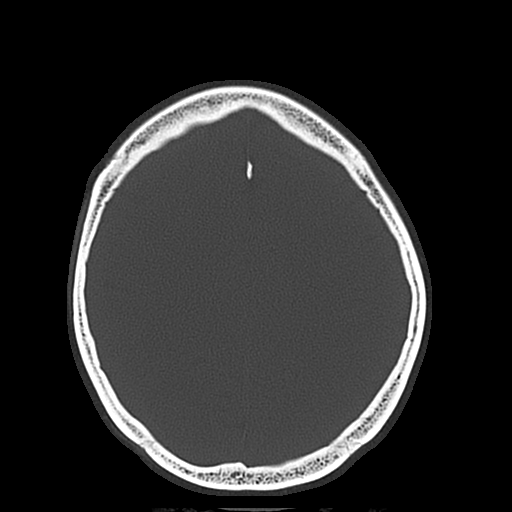
[im 49/72  bone]
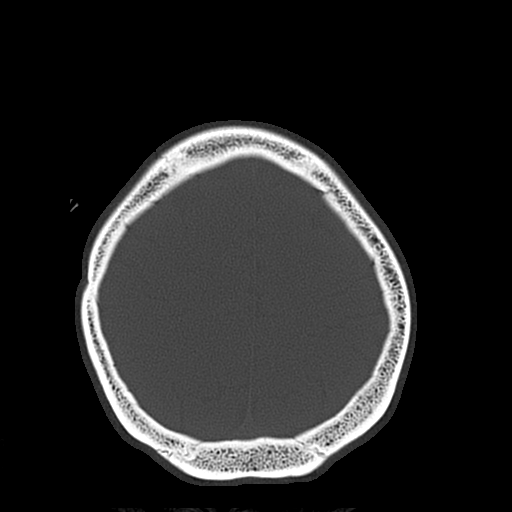
[im 57/72  bone]
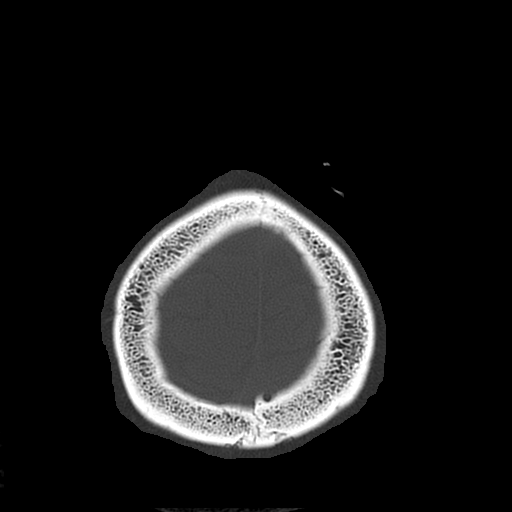
[im 64/72  bone]
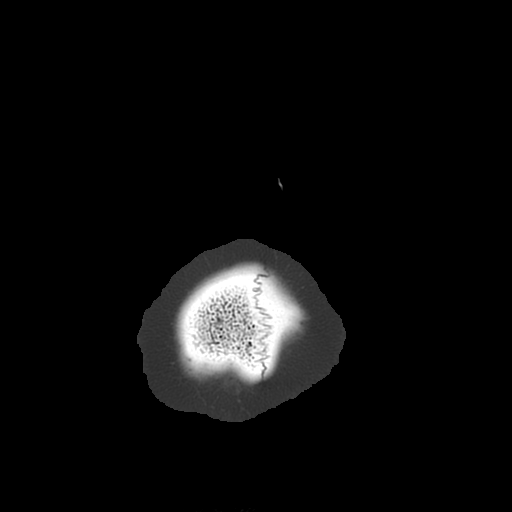

[16 of 30 positions shown; findings below may reference images not displayed]

FINDINGS: Bone windows demonstrate hypoplastic frontal sinuses.
Other paranasal sinuses and mastoid air cells clear.  Petrous
apices partially aerated.

Soft tissue windows demonstrate no  mass lesion, hemorrhage,
hydrocephalus, acute infarct, intra-axial, or extra-axial fluid
collection.
IMPRESSION: Normal head CT.

## 2013-12-02 ENCOUNTER — Other Ambulatory Visit: Payer: Self-pay | Admitting: Family Medicine

## 2013-12-02 DIAGNOSIS — Z1231 Encounter for screening mammogram for malignant neoplasm of breast: Secondary | ICD-10-CM

## 2014-05-14 ENCOUNTER — Ambulatory Visit: Payer: Managed Care, Other (non HMO)

## 2014-05-16 ENCOUNTER — Inpatient Hospital Stay: Admission: RE | Admit: 2014-05-16 | Payer: Managed Care, Other (non HMO) | Source: Ambulatory Visit

## 2014-05-22 ENCOUNTER — Ambulatory Visit
Admission: RE | Admit: 2014-05-22 | Discharge: 2014-05-22 | Disposition: A | Discharge status: Home / Self Care | Payer: BC Managed Care – PPO | Source: Ambulatory Visit | Attending: Family Medicine | Admitting: Family Medicine

## 2014-05-22 DIAGNOSIS — Z1231 Encounter for screening mammogram for malignant neoplasm of breast: Secondary | ICD-10-CM

## 2015-05-08 ENCOUNTER — Other Ambulatory Visit (HOSPITAL_COMMUNITY): Payer: Self-pay | Admitting: Family Medicine

## 2015-05-08 DIAGNOSIS — Z1231 Encounter for screening mammogram for malignant neoplasm of breast: Secondary | ICD-10-CM

## 2015-05-20 ENCOUNTER — Ambulatory Visit (HOSPITAL_COMMUNITY): Payer: Managed Care, Other (non HMO)

## 2015-05-20 ENCOUNTER — Ambulatory Visit (HOSPITAL_COMMUNITY): Admission: RE | Admit: 2015-05-20 | Payer: BLUE CROSS/BLUE SHIELD | Source: Ambulatory Visit

## 2015-09-01 ENCOUNTER — Ambulatory Visit: Payer: Self-pay | Admitting: Primary Care

## 2015-10-04 ENCOUNTER — Encounter (HOSPITAL_COMMUNITY): Payer: Self-pay | Admitting: Emergency Medicine

## 2015-10-04 ENCOUNTER — Emergency Department (INDEPENDENT_AMBULATORY_CARE_PROVIDER_SITE_OTHER)
Admission: EM | Admit: 2015-10-04 | Discharge: 2015-10-04 | Disposition: A | Discharge status: Home / Self Care | Payer: BLUE CROSS/BLUE SHIELD | Source: Home / Self Care | Attending: Family Medicine | Admitting: Family Medicine

## 2015-10-04 DIAGNOSIS — R52 Pain, unspecified: Secondary | ICD-10-CM | POA: Diagnosis not present

## 2015-10-04 LAB — POCT I-STAT, CHEM 8
BUN: 14 mg/dL (ref 6–20)
CALCIUM ION: 1.22 mmol/L (ref 1.12–1.23)
CHLORIDE: 103 mmol/L (ref 101–111)
CREATININE: 1.2 mg/dL — AB (ref 0.44–1.00)
GLUCOSE: 84 mg/dL (ref 65–99)
HCT: 42 % (ref 36.0–46.0)
Hemoglobin: 14.3 g/dL (ref 12.0–15.0)
POTASSIUM: 3.5 mmol/L (ref 3.5–5.1)
Sodium: 142 mmol/L (ref 135–145)
TCO2: 27 mmol/L (ref 0–100)

## 2015-10-04 MED ORDER — LORAZEPAM 1 MG PO TABS: 1.0000 mg | ORAL_TABLET | Freq: Three times a day (TID) | ORAL | Status: AC | PRN

## 2015-10-04 NOTE — ED Notes (Signed)
Here with worsening generalized body aches all over since Tuesday States the pain feels like a stabbing sensation then moves to next area Denies medical problems Not taking medication daily Tried Aspirin 81 mg tab

## 2015-10-04 NOTE — ED Provider Notes (Signed)
CSN: [id number]     Arrival date & time 10/04/15  1648 History   First MD Initiated Contact with Patient 10/04/15 1713     Chief Complaint  Patient presents with  . Generalized Body Aches   (Consider location/radiation/quality/duration/timing/severity/associated sxs/prior Treatment) HPI Shocks over her body since Wednesday. Lasts for 5 minutes then another shock elsewhere. States that this interferes with work, sleep. No home treatment.  History reviewed. No pertinent past medical history. Past Surgical History  Procedure Laterality Date  . Abdominal hysterectomy     No family history on file. Social History  Substance Use Topics  . Smoking status: Never Smoker   . Smokeless tobacco: None  . Alcohol Use: No   OB History    No data available     Review of Systems ROS +'ve shock to body  Denies: HEADACHE, NAUSEA, ABDOMINAL PAIN, CHEST PAIN, CONGESTION, DYSURIA, SHORTNESS OF BREATH    Allergies  Review of patient's allergies indicates no known allergies.  Home Medications   Prior to Admission medications   Medication Sig Start Date End Date Taking? Authorizing Provider  acetaminophen (TYLENOL) 500 MG tablet Take 1 tablet (500 mg total) by mouth every 6 (six) hours as needed for pain. 06/25/12   Gerhard Munch, MD  esomeprazole (NEXIUM) 40 MG capsule Take 40 mg by mouth daily before breakfast.    Historical Provider, MD  traZODone (DESYREL) 100 MG tablet Take 100 mg by mouth as needed.    Historical Provider, MD   Meds Ordered and Administered this Visit  Medications - No data to display  BP 122/72 mmHg  Pulse 74  Temp(Src) 98.1 F (36.7 C) (Oral)  Resp 18  SpO2 99% No data found.   Physical Exam NURSES NOTES AND VITAL SIGNS REVIEWED. CONSTITUTIONAL: Well developed, well nourished, no acute distress HEENT: normocephalic, atraumatic EYES: Conjunctiva normal NECK:normal ROM, supple PULMONARY:No respiratory distress, normal effort, Lungs: CTAb/l CARDIOVASCULAR:  RRR, no murmur ABDOMEN: soft, ND, NT, +'ve BS MUSCULOSKELETAL: Normal ROM of all extremities SKIN: warm and dry without rash PSYCHIATRIC: Mood and affect normal  ED Course  Procedures (including critical care time)  Labs Review Labs Reviewed - No data to display  Imaging Review No results found.   Visual Acuity Review  Right Eye Distance:   Left Eye Distance:   Bilateral Distance:    Right Eye Near:   Left Eye Near:    Bilateral Near:         MDM   1. Body aches   (shocks) No explanation in blood tests May need review by specialist if symptoms persist.    Tharon Aquas, PA 10/04/15 1828

## 2015-10-04 NOTE — Discharge Instructions (Signed)
Your blood tests are normal with the exception of your creatinine which is slightly elevated but has been stable and elevated since 2013.  See your primary doctor for follow up  If your symptoms continue you may need to see a specialist.

## 2016-06-13 DIAGNOSIS — R0982 Postnasal drip: Secondary | ICD-10-CM | POA: Diagnosis not present

## 2016-06-13 DIAGNOSIS — Z0000 Encounter for general adult medical examination without abnormal findings: Secondary | ICD-10-CM | POA: Diagnosis not present

## 2016-06-13 DIAGNOSIS — R079 Chest pain, unspecified: Secondary | ICD-10-CM | POA: Diagnosis not present

## 2016-06-13 DIAGNOSIS — R001 Bradycardia, unspecified: Secondary | ICD-10-CM | POA: Diagnosis not present

## 2016-06-13 DIAGNOSIS — Z13220 Encounter for screening for lipoid disorders: Secondary | ICD-10-CM | POA: Diagnosis not present

## 2016-06-13 DIAGNOSIS — G5600 Carpal tunnel syndrome, unspecified upper limb: Secondary | ICD-10-CM | POA: Diagnosis not present

## 2016-06-16 ENCOUNTER — Other Ambulatory Visit: Payer: Self-pay | Admitting: Cardiology

## 2016-06-16 ENCOUNTER — Other Ambulatory Visit: Payer: BLUE CROSS/BLUE SHIELD

## 2016-06-16 ENCOUNTER — Ambulatory Visit
Admission: RE | Admit: 2016-06-16 | Discharge: 2016-06-16 | Disposition: A | Discharge status: Home / Self Care | Payer: BLUE CROSS/BLUE SHIELD | Source: Ambulatory Visit | Attending: Cardiology | Admitting: Cardiology

## 2016-06-16 ENCOUNTER — Ambulatory Visit
Admission: RE | Admit: 2016-06-16 | Discharge: 2016-06-16 | Disposition: A | Discharge status: Home / Self Care | Payer: BLUE CROSS/BLUE SHIELD | Source: Ambulatory Visit

## 2016-06-16 DIAGNOSIS — R0602 Shortness of breath: Secondary | ICD-10-CM

## 2016-06-16 DIAGNOSIS — R0609 Other forms of dyspnea: Secondary | ICD-10-CM | POA: Diagnosis not present

## 2016-06-16 DIAGNOSIS — R001 Bradycardia, unspecified: Secondary | ICD-10-CM | POA: Diagnosis not present

## 2016-06-16 DIAGNOSIS — R0789 Other chest pain: Secondary | ICD-10-CM | POA: Diagnosis not present

## 2016-06-17 ENCOUNTER — Other Ambulatory Visit: Payer: Self-pay | Admitting: Cardiology

## 2016-06-17 ENCOUNTER — Ambulatory Visit: Admission: RE | Admit: 2016-06-17 | Payer: BLUE CROSS/BLUE SHIELD | Source: Ambulatory Visit

## 2016-06-17 DIAGNOSIS — R0602 Shortness of breath: Secondary | ICD-10-CM

## 2016-07-05 DIAGNOSIS — R0602 Shortness of breath: Secondary | ICD-10-CM | POA: Diagnosis not present

## 2016-07-05 DIAGNOSIS — R0789 Other chest pain: Secondary | ICD-10-CM | POA: Diagnosis not present

## 2016-07-08 DIAGNOSIS — R0602 Shortness of breath: Secondary | ICD-10-CM | POA: Diagnosis not present

## 2016-07-08 DIAGNOSIS — R001 Bradycardia, unspecified: Secondary | ICD-10-CM | POA: Diagnosis not present

## 2016-07-08 DIAGNOSIS — R0789 Other chest pain: Secondary | ICD-10-CM | POA: Diagnosis not present

## 2016-08-30 DIAGNOSIS — B349 Viral infection, unspecified: Secondary | ICD-10-CM | POA: Diagnosis not present

## 2016-08-30 DIAGNOSIS — R05 Cough: Secondary | ICD-10-CM | POA: Diagnosis not present

## 2016-08-30 DIAGNOSIS — J029 Acute pharyngitis, unspecified: Secondary | ICD-10-CM | POA: Diagnosis not present

## 2016-12-28 DIAGNOSIS — M20011 Mallet finger of right finger(s): Secondary | ICD-10-CM | POA: Diagnosis not present

## 2016-12-28 DIAGNOSIS — M79644 Pain in right finger(s): Secondary | ICD-10-CM | POA: Diagnosis not present

## 2017-01-02 DIAGNOSIS — M79644 Pain in right finger(s): Secondary | ICD-10-CM | POA: Diagnosis not present

## 2017-01-04 ENCOUNTER — Encounter (HOSPITAL_COMMUNITY): Payer: Self-pay | Admitting: Emergency Medicine

## 2017-01-04 ENCOUNTER — Emergency Department (HOSPITAL_COMMUNITY): Payer: BLUE CROSS/BLUE SHIELD

## 2017-01-04 ENCOUNTER — Emergency Department (HOSPITAL_COMMUNITY)
Admission: EM | Admit: 2017-01-04 | Discharge: 2017-01-04 | Disposition: A | Discharge status: Home / Self Care | Payer: BLUE CROSS/BLUE SHIELD | Attending: Emergency Medicine | Admitting: Emergency Medicine

## 2017-01-04 DIAGNOSIS — S199XXA Unspecified injury of neck, initial encounter: Secondary | ICD-10-CM | POA: Diagnosis not present

## 2017-01-04 DIAGNOSIS — Y939 Activity, unspecified: Secondary | ICD-10-CM | POA: Diagnosis not present

## 2017-01-04 DIAGNOSIS — Y92410 Unspecified street and highway as the place of occurrence of the external cause: Secondary | ICD-10-CM | POA: Diagnosis not present

## 2017-01-04 DIAGNOSIS — Z79899 Other long term (current) drug therapy: Secondary | ICD-10-CM | POA: Insufficient documentation

## 2017-01-04 DIAGNOSIS — R0789 Other chest pain: Secondary | ICD-10-CM | POA: Diagnosis not present

## 2017-01-04 DIAGNOSIS — Y999 Unspecified external cause status: Secondary | ICD-10-CM | POA: Diagnosis not present

## 2017-01-04 DIAGNOSIS — M542 Cervicalgia: Secondary | ICD-10-CM | POA: Diagnosis not present

## 2017-01-04 DIAGNOSIS — S299XXA Unspecified injury of thorax, initial encounter: Secondary | ICD-10-CM | POA: Diagnosis not present

## 2017-01-04 DIAGNOSIS — S279XXA Injury of unspecified intrathoracic organ, initial encounter: Secondary | ICD-10-CM | POA: Diagnosis not present

## 2017-01-04 MED ORDER — ACETAMINOPHEN 325 MG PO TABS
650.0000 mg | ORAL_TABLET | Freq: Once | ORAL | Status: AC
  Administered 2017-01-04: 650 mg via ORAL
  Filled 2017-01-04: qty 2

## 2017-01-04 MED ORDER — NAPROXEN 500 MG PO TABS: 500.0000 mg | ORAL_TABLET | Freq: Two times a day (BID) | ORAL | 0 refills | Status: AC

## 2017-01-04 MED ORDER — METHOCARBAMOL 500 MG PO TABS: 500.0000 mg | ORAL_TABLET | Freq: Two times a day (BID) | ORAL | 0 refills | Status: AC
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
## 2017-01-04 NOTE — ED Notes (Signed)
Transported to X-ray
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
## 2017-01-05 DIAGNOSIS — Z131 Encounter for screening for diabetes mellitus: Secondary | ICD-10-CM | POA: Diagnosis not present

## 2017-01-05 DIAGNOSIS — Z136 Encounter for screening for cardiovascular disorders: Secondary | ICD-10-CM | POA: Diagnosis not present

## 2017-01-05 DIAGNOSIS — H538 Other visual disturbances: Secondary | ICD-10-CM | POA: Diagnosis not present

## 2017-01-05 DIAGNOSIS — Z01118 Encounter for examination of ears and hearing with other abnormal findings: Secondary | ICD-10-CM | POA: Diagnosis not present

## 2017-01-05 DIAGNOSIS — M542 Cervicalgia: Secondary | ICD-10-CM | POA: Diagnosis not present

## 2017-01-05 DIAGNOSIS — Z0000 Encounter for general adult medical examination without abnormal findings: Secondary | ICD-10-CM | POA: Diagnosis not present

## 2017-01-10 DIAGNOSIS — E785 Hyperlipidemia, unspecified: Secondary | ICD-10-CM | POA: Diagnosis not present

## 2017-01-10 DIAGNOSIS — M19041 Primary osteoarthritis, right hand: Secondary | ICD-10-CM | POA: Diagnosis not present

## 2017-01-10 DIAGNOSIS — M6740 Ganglion, unspecified site: Secondary | ICD-10-CM | POA: Diagnosis not present

## 2017-01-10 DIAGNOSIS — N289 Disorder of kidney and ureter, unspecified: Secondary | ICD-10-CM | POA: Diagnosis not present

## 2017-01-10 DIAGNOSIS — M542 Cervicalgia: Secondary | ICD-10-CM | POA: Diagnosis not present

## 2017-01-10 DIAGNOSIS — R7303 Prediabetes: Secondary | ICD-10-CM | POA: Diagnosis not present

## 2017-01-23 DIAGNOSIS — M50122 Cervical disc disorder at C5-C6 level with radiculopathy: Secondary | ICD-10-CM | POA: Diagnosis not present

## 2017-01-31 DIAGNOSIS — E785 Hyperlipidemia, unspecified: Secondary | ICD-10-CM | POA: Diagnosis not present

## 2017-01-31 DIAGNOSIS — R7303 Prediabetes: Secondary | ICD-10-CM | POA: Diagnosis not present

## 2017-01-31 DIAGNOSIS — N289 Disorder of kidney and ureter, unspecified: Secondary | ICD-10-CM | POA: Diagnosis not present

## 2017-01-31 DIAGNOSIS — M542 Cervicalgia: Secondary | ICD-10-CM | POA: Diagnosis not present

## 2017-02-17 DIAGNOSIS — R232 Flushing: Secondary | ICD-10-CM | POA: Diagnosis not present

## 2017-07-13 DIAGNOSIS — N644 Mastodynia: Secondary | ICD-10-CM | POA: Diagnosis not present

## 2017-07-18 ENCOUNTER — Other Ambulatory Visit: Payer: Self-pay | Admitting: Family Medicine

## 2017-07-18 DIAGNOSIS — N644 Mastodynia: Secondary | ICD-10-CM

## 2017-07-26 ENCOUNTER — Ambulatory Visit
Admission: RE | Admit: 2017-07-26 | Discharge: 2017-07-26 | Disposition: A | Discharge status: Home / Self Care | Payer: BLUE CROSS/BLUE SHIELD | Source: Ambulatory Visit | Attending: Family Medicine | Admitting: Family Medicine

## 2017-07-26 DIAGNOSIS — N644 Mastodynia: Secondary | ICD-10-CM

## 2017-07-26 DIAGNOSIS — R928 Other abnormal and inconclusive findings on diagnostic imaging of breast: Secondary | ICD-10-CM | POA: Diagnosis not present

## 2017-07-26 DIAGNOSIS — N6489 Other specified disorders of breast: Secondary | ICD-10-CM | POA: Diagnosis not present

## 2017-07-27 ENCOUNTER — Other Ambulatory Visit: Payer: Self-pay | Admitting: Family Medicine

## 2017-08-30 ENCOUNTER — Other Ambulatory Visit: Payer: Self-pay

## 2017-08-30 DIAGNOSIS — M7989 Other specified soft tissue disorders: Secondary | ICD-10-CM | POA: Diagnosis not present

## 2017-08-30 DIAGNOSIS — L988 Other specified disorders of the skin and subcutaneous tissue: Secondary | ICD-10-CM | POA: Diagnosis not present

## 2017-08-30 DIAGNOSIS — M19041 Primary osteoarthritis, right hand: Secondary | ICD-10-CM | POA: Diagnosis not present

## 2017-08-30 DIAGNOSIS — M67441 Ganglion, right hand: Secondary | ICD-10-CM | POA: Diagnosis not present

## 2017-09-18 IMAGING — CR DG CHEST 2V
2 series · 2 of 2 positions shown · non-contrast
Comparison: Chest x-ray of June 25, 2012

CLINICAL DATA: Several month history of shortness of breath. No
cardiopulmonary history otherwise. Nonsmoker.

EXAM:
CHEST  2 VIEW

[w chest pa]
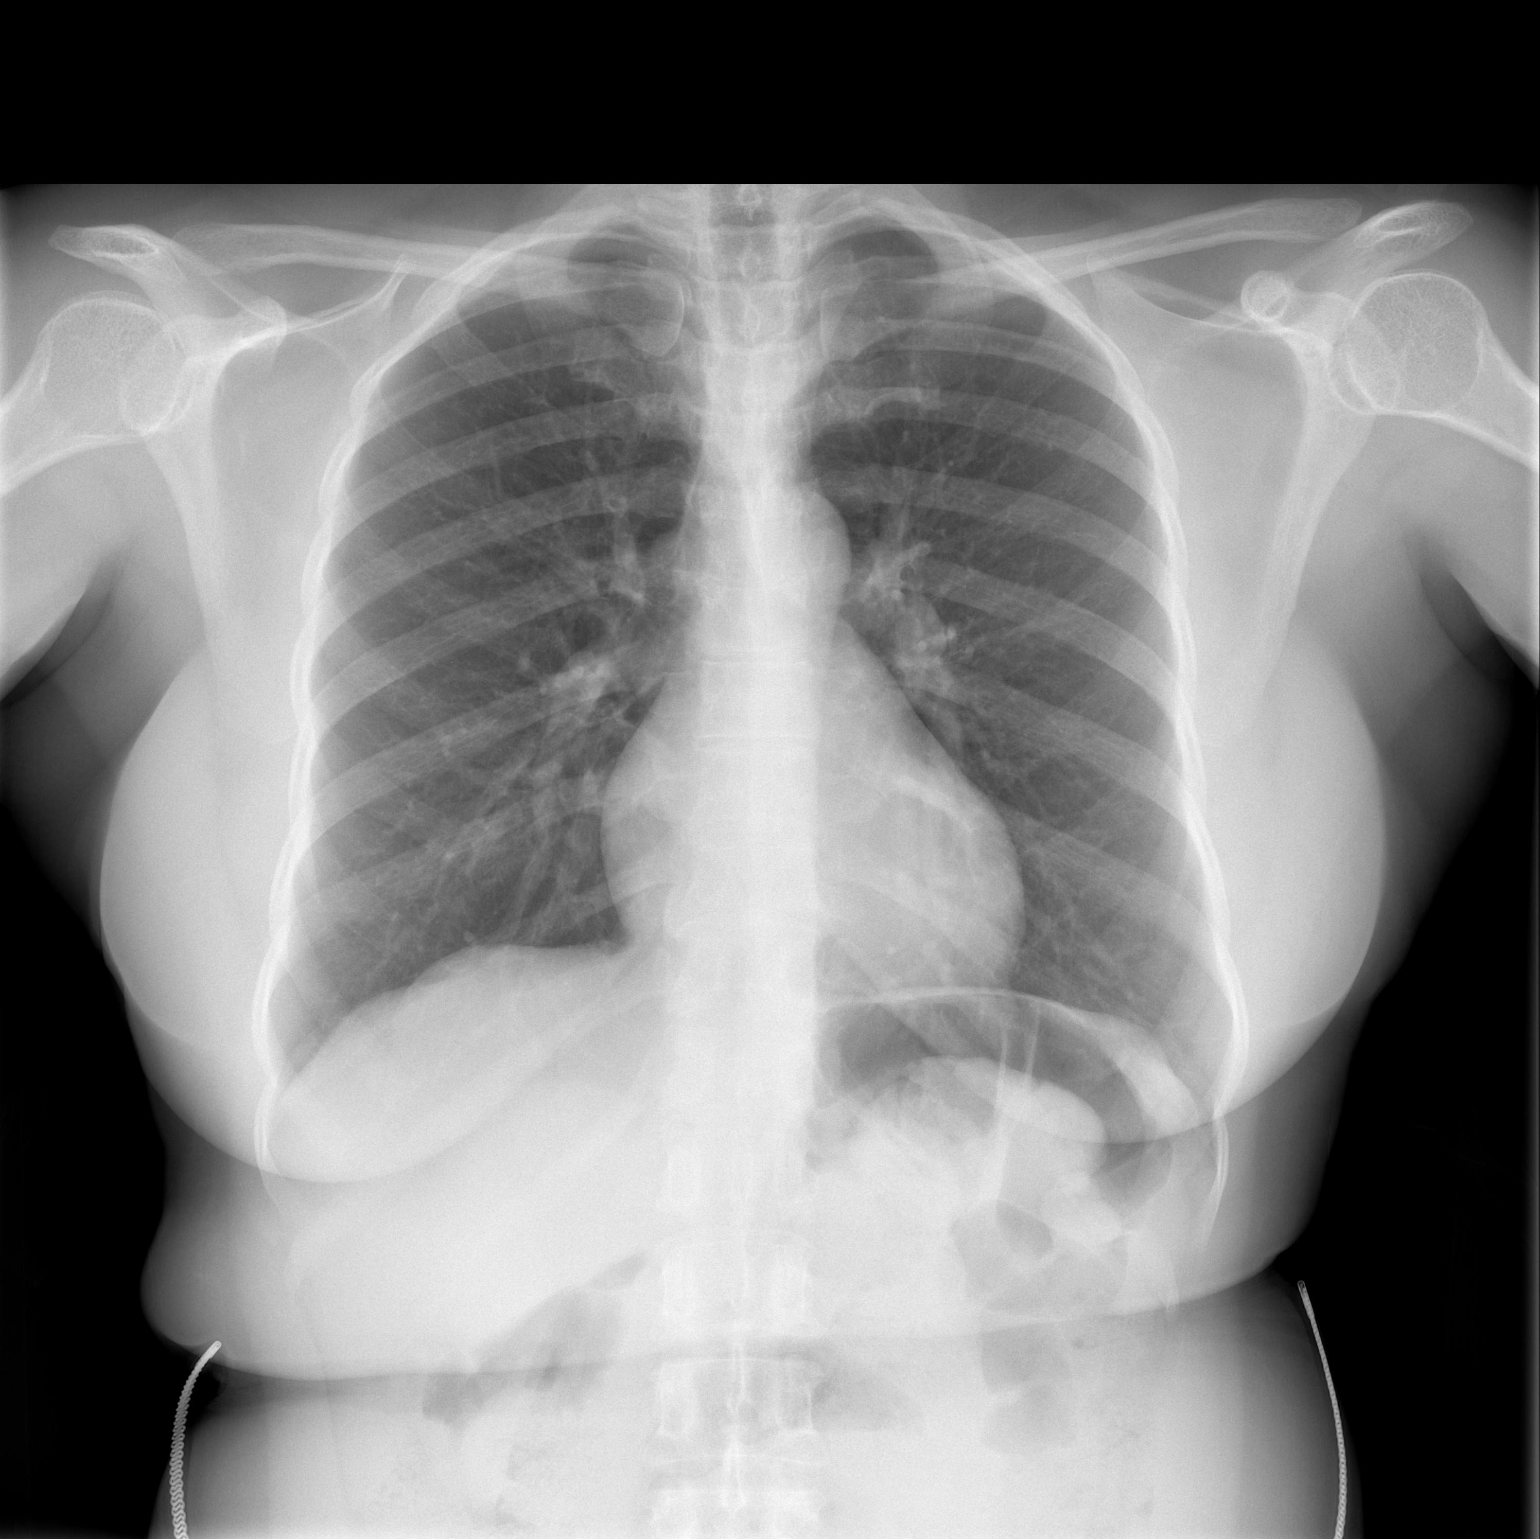

[w chest lat]
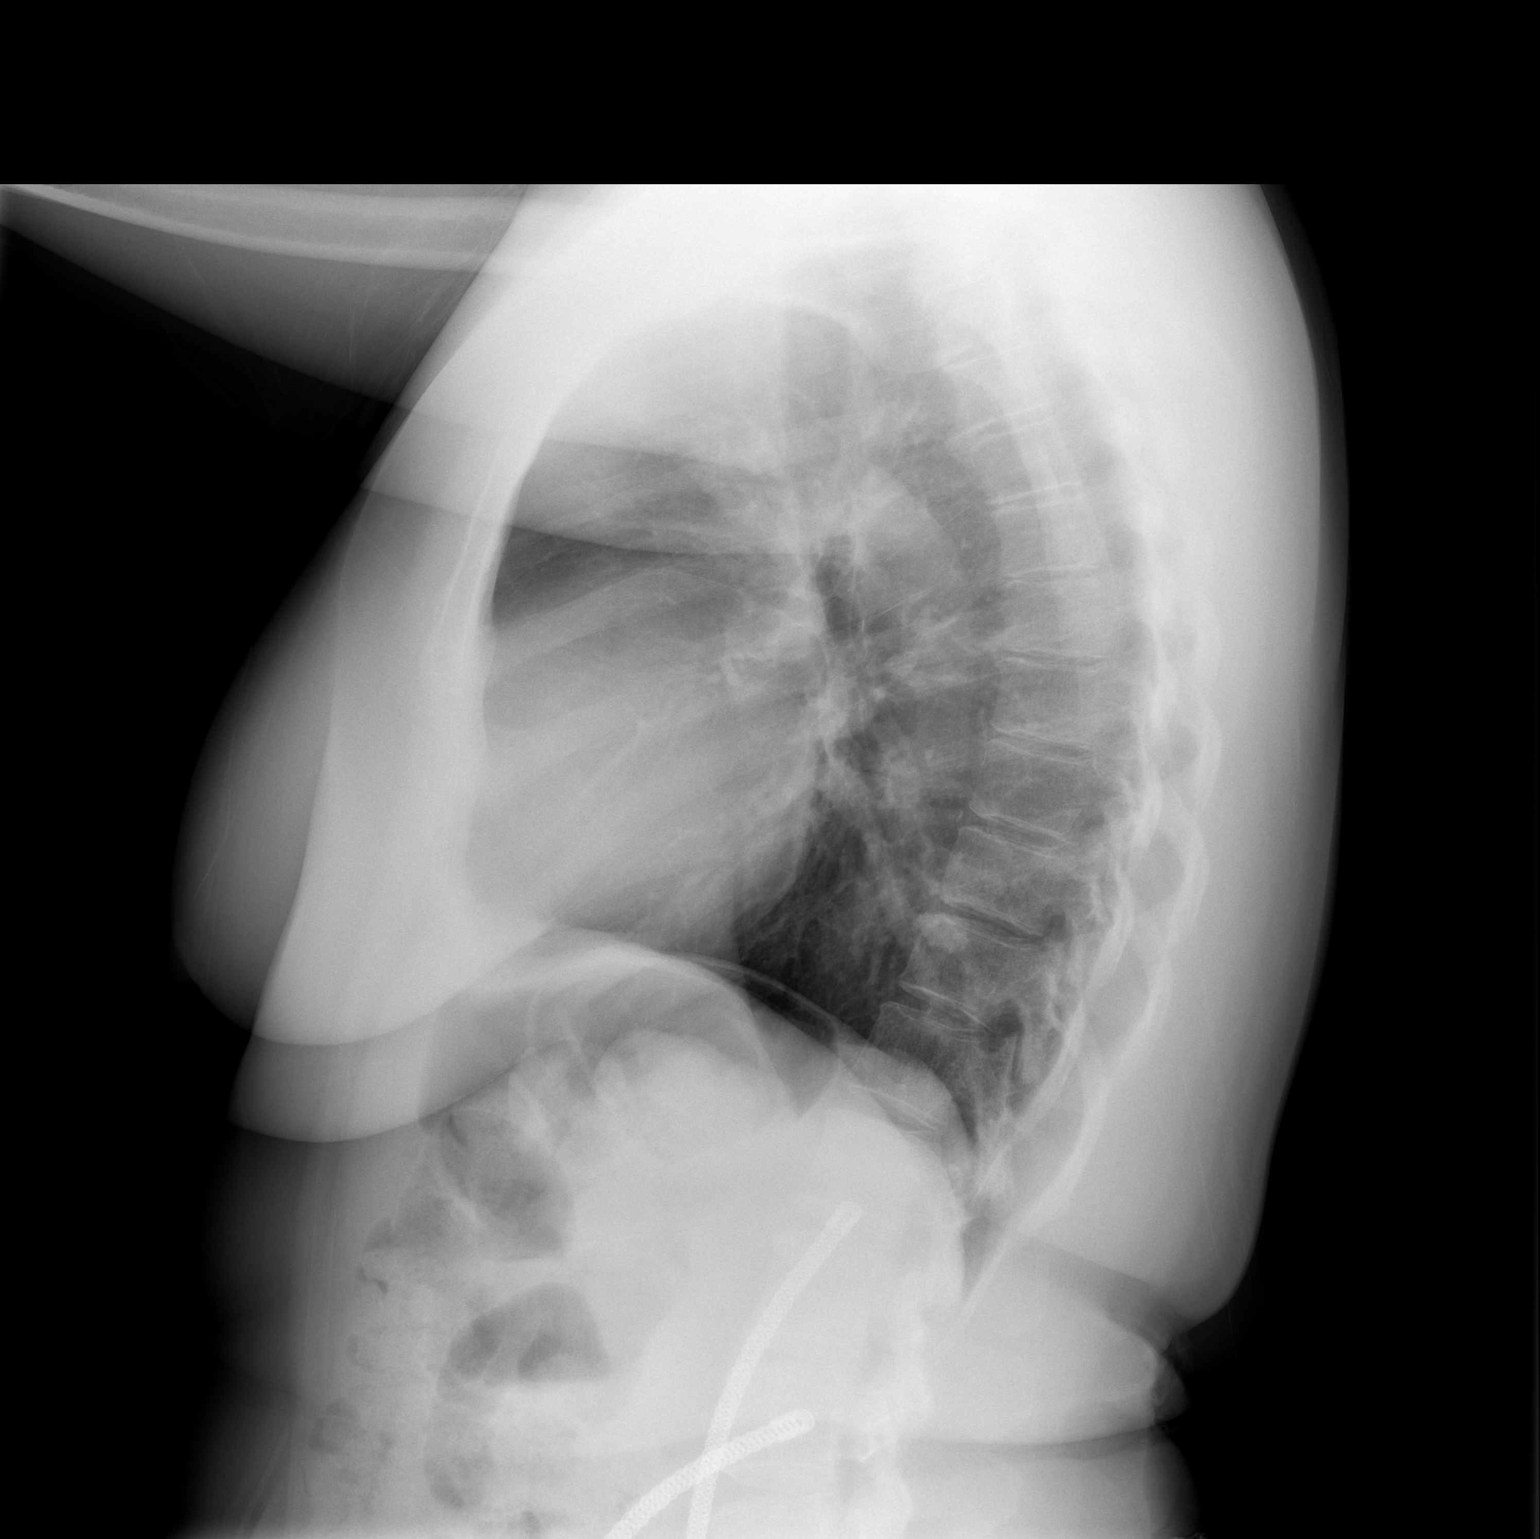

[2 of 2 positions shown; findings below may reference images not displayed]

FINDINGS: The lungs are adequately inflated and clear. The heart and pulmonary
vascularity are normal. The mediastinum is normal in width. There is
no pleural effusion. The bony thorax exhibits no acute abnormality.
IMPRESSION: There is no active cardiopulmonary disease.

## 2017-09-25 DIAGNOSIS — M6740 Ganglion, unspecified site: Secondary | ICD-10-CM | POA: Diagnosis not present

## 2017-09-25 DIAGNOSIS — M19041 Primary osteoarthritis, right hand: Secondary | ICD-10-CM | POA: Diagnosis not present

## 2017-09-30 DIAGNOSIS — J069 Acute upper respiratory infection, unspecified: Secondary | ICD-10-CM | POA: Diagnosis not present

## 2017-11-06 DIAGNOSIS — J0100 Acute maxillary sinusitis, unspecified: Secondary | ICD-10-CM | POA: Diagnosis not present

## 2017-11-09 ENCOUNTER — Encounter (HOSPITAL_COMMUNITY): Payer: Self-pay

## 2017-11-09 ENCOUNTER — Emergency Department (HOSPITAL_COMMUNITY)
Admission: EM | Admit: 2017-11-09 | Discharge: 2017-11-09 | Disposition: A | Discharge status: Home / Self Care | Payer: BLUE CROSS/BLUE SHIELD | Attending: Emergency Medicine | Admitting: Emergency Medicine

## 2017-11-09 ENCOUNTER — Other Ambulatory Visit: Payer: Self-pay

## 2017-11-09 DIAGNOSIS — R6889 Other general symptoms and signs: Secondary | ICD-10-CM | POA: Diagnosis not present

## 2017-11-09 DIAGNOSIS — Z7982 Long term (current) use of aspirin: Secondary | ICD-10-CM | POA: Diagnosis not present

## 2017-11-09 DIAGNOSIS — Z79899 Other long term (current) drug therapy: Secondary | ICD-10-CM | POA: Insufficient documentation

## 2017-11-09 DIAGNOSIS — R404 Transient alteration of awareness: Secondary | ICD-10-CM | POA: Diagnosis not present

## 2017-11-09 DIAGNOSIS — R55 Syncope and collapse: Secondary | ICD-10-CM

## 2017-11-09 LAB — URINALYSIS, ROUTINE W REFLEX MICROSCOPIC
Bilirubin Urine: NEGATIVE
GLUCOSE, UA: NEGATIVE mg/dL
HGB URINE DIPSTICK: NEGATIVE
Ketones, ur: 5 mg/dL — AB
LEUKOCYTES UA: NEGATIVE
NITRITE: NEGATIVE
PH: 5 (ref 5.0–8.0)
Protein, ur: 30 mg/dL — AB
Specific Gravity, Urine: 1.028 (ref 1.005–1.030)

## 2017-11-09 LAB — BASIC METABOLIC PANEL
ANION GAP: 10 (ref 5–15)
BUN: 14 mg/dL (ref 6–20)
CO2: 26 mmol/L (ref 22–32)
Calcium: 9.3 mg/dL (ref 8.9–10.3)
Chloride: 102 mmol/L (ref 101–111)
Creatinine, Ser: 1.2 mg/dL — ABNORMAL HIGH (ref 0.44–1.00)
GFR, EST AFRICAN AMERICAN: 57 mL/min — AB (ref >60)
GFR, EST NON AFRICAN AMERICAN: 50 mL/min — AB (ref >60)
GLUCOSE: 127 mg/dL — AB (ref 65–99)
POTASSIUM: 4.3 mmol/L (ref 3.5–5.1)
SODIUM: 138 mmol/L (ref 135–145)

## 2017-11-09 LAB — CBC
HCT: 40.5 % (ref 36.0–46.0)
HEMOGLOBIN: 13.4 g/dL (ref 12.0–15.0)
MCH: 30.5 pg (ref 26.0–34.0)
MCHC: 33.1 g/dL (ref 30.0–36.0)
MCV: 92.3 fL (ref 78.0–100.0)
Platelets: 221 10*3/uL (ref 150–400)
RBC: 4.39 MIL/uL (ref 3.87–5.11)
RDW: 12.9 % (ref 11.5–15.5)
WBC: 6.1 10*3/uL (ref 4.0–10.5)

## 2017-11-09 MED ORDER — SODIUM CHLORIDE 0.9 % IV BOLUS (SEPSIS)
1000.0000 mL | Freq: Once | INTRAVENOUS | Status: AC
  Administered 2017-11-09: 1000 mL via INTRAVENOUS

## 2017-11-09 MED ORDER — HYDROCODONE-HOMATROPINE 5-1.5 MG/5ML PO SYRP
5.0000 mL | ORAL_SOLUTION | Freq: Once | ORAL | Status: AC
  Administered 2017-11-09: 5 mL via ORAL
  Filled 2017-11-09: qty 5

## 2017-11-09 MED ORDER — DEXTROMETHORPHAN-GUAIFENESIN 10-100 MG/5ML PO LIQD: 5.0000 mL | ORAL | 0 refills | Status: AC | PRN

## 2017-11-09 MED ORDER — ACETAMINOPHEN 500 MG PO TABS
1000.0000 mg | ORAL_TABLET | Freq: Once | ORAL | Status: AC
  Administered 2017-11-09: 1000 mg via ORAL
  Filled 2017-11-09: qty 2

## 2017-11-09 NOTE — ED Provider Notes (Signed)
[facility]   CSN: [id number] Arrival date & time: 11/09/17  1015     History   Chief Complaint Chief Complaint  Patient presents with  . Loss of Consciousness    HPI Kelly Sawyer is a 57 y.o. female.  HPI Patient presents after an episode of syncope. Patient was at work, felt dizzy, and the next thing she remembers is being with EMS providers, arriving to the hospital. She is here with one friend, and one coworker. The patient notes that she has had a mild cold over the past few days, but has been generally well. Today, she had no pain, prior to the episode, but upon awakening had a mild diffuse headache, but no chest pain. No confusion, disorientation, vision changes, focal weakness in extremities. Coworker notes that the patient was observed by others to fall, and reportedly struck her head. Both coworker and friend state the patient appears"normal", currently.  History reviewed. No pertinent past medical history.  There are no active problems to display for this patient.   Past Surgical History:  Procedure Laterality Date  . ABDOMINAL HYSTERECTOMY      OB History    No data available       Home Medications    Prior to Admission medications   Medication Sig Start Date End Date Taking? Authorizing Provider  amoxicillin-clavulanate (AUGMENTIN) 875-125 MG tablet Take 1 tablet by mouth 2 (two) times daily. Started on 11-06-17 for 10 days 11/06/17  Yes [provider]  aspirin 325 MG tablet Take 650 mg by mouth once.   Yes [provider]  brompheniramine-pseudoephedrine-DM 30-2-10 MG/5ML syrup Take 10 mLs by mouth 4 (four) times daily as needed for cough. 09/30/17  Yes [provider]  cholecalciferol (VITAMIN D) 1000 units tablet Take 1,000 Units by mouth daily.   Yes [provider]  Cod Liver Oil 1000 MG CAPS Take by mouth.   Yes [provider]  ibuprofen  (ADVIL,MOTRIN) 200 MG tablet Take 200 mg by mouth every 6 (six) hours as needed for moderate pain.   Yes [provider]  ipratropium (ATROVENT) 0.06 % nasal spray Place 2 sprays into the nose 4 (four) times daily. 11/06/17  Yes [provider]  pantoprazole (PROTONIX) 40 MG tablet Take 40 mg by mouth daily. 12/13/16  Yes [provider]  acetaminophen (TYLENOL) 500 MG tablet Take 1 tablet (500 mg total) by mouth every 6 (six) hours as needed for pain. Patient not taking: Reported on 01/04/2017 06/25/12   Gerhard MunchLockwood, Stoney Karczewski, MD  BLACK COHOSH EXTRACT PO Take 1 tablet by mouth daily as needed (hot flashes).    [provider]  LORazepam (ATIVAN) 1 MG tablet Take 1 tablet (1 mg total) by mouth every 8 (eight) hours as needed for anxiety. Patient not taking: Reported on 01/04/2017 10/04/15   Tharon AquasPatrick, Frank C, PA  methocarbamol (ROBAXIN) 500 MG tablet Take 1 tablet (500 mg total) by mouth 2 (two) times daily. Patient not taking: Reported on 11/09/2017 01/04/17   Garlon HatchetSanders, Lisa M, PA-C  naproxen (NAPROSYN) 500 MG tablet Take 1 tablet (500 mg total) by mouth 2 (two) times daily with a meal. Patient not taking: Reported on 11/09/2017 01/04/17   Garlon HatchetSanders, Lisa M, PA-C    Family History History reviewed. No pertinent family history.  Social History Social History   Tobacco Use  . Smoking status: Never Smoker  . Smokeless tobacco: Never Used  Substance Use Topics  .  Alcohol use: No  . Drug use: No     Allergies   Patient has no known allergies.   Review of Systems Review of Systems  Constitutional:       Per HPI, otherwise negative  HENT:       Per HPI, otherwise negative  Respiratory:       Per HPI, otherwise negative  Cardiovascular:       Per HPI, otherwise negative  Gastrointestinal: Negative for vomiting.  Endocrine:       Negative aside from HPI  Genitourinary:       Neg aside from HPI   Musculoskeletal:       Per HPI, otherwise negative  Skin:  Negative.   Neurological: Positive for dizziness, syncope, light-headedness and headaches.     Physical Exam Updated Vital Signs BP (!) 109/55 (BP Location: Right Arm)   Pulse 80   Temp 99 F (37.2 C) (Oral)   Ht 5\' 1"  (1.549 m)   Wt 66.7 kg (147 lb)   SpO2 99%   BMI 27.78 kg/m   Physical Exam  Constitutional: She is oriented to person, place, and time. She appears well-developed and well-nourished. No distress.  HENT:  Head: Normocephalic and atraumatic.  Eyes: Conjunctivae and EOM are normal.  Cardiovascular: Normal rate and regular rhythm.  Pulmonary/Chest: Effort normal and breath sounds normal. No stridor. No respiratory distress.  Abdominal: She exhibits no distension.  Musculoskeletal: She exhibits no edema.  Neurological: She is alert and oriented to person, place, and time. No cranial nerve deficit.  Skin: Skin is warm and dry.  Psychiatric: She has a normal mood and affect.  Nursing note and vitals reviewed.    ED Treatments / Results  Labs (all labs ordered are listed, but only abnormal results are displayed) Labs Reviewed  BASIC METABOLIC PANEL - Abnormal; Notable for the following components:      Result Value   Glucose, Bld 127 (*)    Creatinine, Ser 1.20 (*)    GFR calc non Af Amer 50 (*)    GFR calc Af Amer 57 (*)    All other components within normal limits  URINALYSIS, ROUTINE W REFLEX MICROSCOPIC - Abnormal; Notable for the following components:   Color, Urine AMBER (*)    APPearance CLOUDY (*)    Ketones, ur 5 (*)    Protein, ur 30 (*)    Bacteria, UA RARE (*)    Squamous Epithelial / LPF 6-30 (*)    All other components within normal limits  CBC  CBG MONITORING, ED    EKG  EKG Interpretation  Date/Time:  Thursday November 09 2017 10:16:31 EST Ventricular Rate:  75 PR Interval:  140 QRS Duration: 82 QT Interval:  364 QTC Calculation: 406 R Axis:   26 Text Interpretation:  Normal sinus rhythm Right atrial enlargement Artifact  Abnormal ekg Confirmed by Gerhard Munch [phone]) on 11/09/2017 12:09:06 PM        Procedures Procedures (including critical care time)  Medications Ordered in ED Medications - No data to display   Initial Impression / Assessment and Plan / ED Course  I have reviewed the triage vital signs and the nursing notes.  Pertinent labs & imaging results that were available during my care of the patient were reviewed by me and considered in my medical decision making (see chart for details).    Patient awake alert, in no distress. She has received fluid resuscitation, has had no additional episodes of syncope, nor any  substantial new complaints per Patient does have mild cough, which she did not earlier endorse. Lung sounds were clear on exam, patient is afebrile, no leukocytosis, there is low suspicion for pneumonia. No evidence for sustained arrhythmia, ACS. No evidence for PE, no hypoxia, tachycardia, tachypnea. Patient's dehydration, ongoing mild illness may be contributed to her episode of syncope. With otherwise reassuring findings, no evidence for arrhythmia, the patient was discharged to follow-up with primary care.  Final Clinical Impressions(s) / ED Diagnoses  Syncope   Gerhard Munch, MD 11/09/17 1735

## 2017-11-09 NOTE — ED Notes (Signed)
Got patient undress on the monitor patient is resting with call bell in reach and family at bedside 

## 2017-11-09 NOTE — Discharge Instructions (Signed)
As discussed, your evaluation today has been largely reassuring.  But, it is important that you monitor your condition carefully, and do not hesitate to return to the ED if you develop new, or concerning changes in your condition. ? ?Otherwise, please follow-up with your physician for appropriate ongoing care. ? ?

## 2017-11-09 NOTE — ED Triage Notes (Signed)
[facility]- pt coming from work after she had a syncopal episode. She fell and hit her head on a metal pole. No LOC. 12 lead unremarkable. 110/62, HR 77, 100% SPO2, RR 18, cbg 168.
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
## 2018-08-13 DIAGNOSIS — T300 Burn of unspecified body region, unspecified degree: Secondary | ICD-10-CM | POA: Diagnosis not present

## 2019-11-18 ENCOUNTER — Other Ambulatory Visit: Payer: Self-pay | Admitting: Family Medicine

## 2019-11-18 DIAGNOSIS — Z1231 Encounter for screening mammogram for malignant neoplasm of breast: Secondary | ICD-10-CM

## 2019-11-18 DIAGNOSIS — E2839 Other primary ovarian failure: Secondary | ICD-10-CM

## 2020-05-27 ENCOUNTER — Other Ambulatory Visit: Payer: Self-pay

## 2020-05-27 ENCOUNTER — Ambulatory Visit
Admission: RE | Admit: 2020-05-27 | Discharge: 2020-05-27 | Disposition: A | Discharge status: Home / Self Care | Payer: 59 | Source: Ambulatory Visit | Attending: Family Medicine | Admitting: Family Medicine

## 2020-05-27 DIAGNOSIS — Z1231 Encounter for screening mammogram for malignant neoplasm of breast: Secondary | ICD-10-CM

## 2020-06-01 ENCOUNTER — Other Ambulatory Visit: Payer: Self-pay | Admitting: Family Medicine

## 2020-06-01 DIAGNOSIS — R928 Other abnormal and inconclusive findings on diagnostic imaging of breast: Secondary | ICD-10-CM

## 2020-06-19 ENCOUNTER — Other Ambulatory Visit: Payer: Self-pay

## 2020-06-19 ENCOUNTER — Ambulatory Visit
Admission: RE | Admit: 2020-06-19 | Discharge: 2020-06-19 | Disposition: A | Discharge status: Home / Self Care | Payer: 59 | Source: Ambulatory Visit | Attending: Family Medicine | Admitting: Family Medicine

## 2020-06-19 ENCOUNTER — Ambulatory Visit: Payer: 59

## 2020-06-19 DIAGNOSIS — R928 Other abnormal and inconclusive findings on diagnostic imaging of breast: Secondary | ICD-10-CM

## 2023-03-30 ENCOUNTER — Other Ambulatory Visit: Payer: Self-pay | Admitting: Pain Medicine

## 2023-03-30 DIAGNOSIS — Z1231 Encounter for screening mammogram for malignant neoplasm of breast: Secondary | ICD-10-CM

## 2023-03-30 DIAGNOSIS — Z780 Asymptomatic menopausal state: Secondary | ICD-10-CM

## 2023-04-18 ENCOUNTER — Ambulatory Visit: Payer: 59

## 2023-07-11 ENCOUNTER — Ambulatory Visit
Admission: RE | Admit: 2023-07-11 | Discharge: 2023-07-11 | Disposition: A | Discharge status: Home / Self Care | Payer: No Typology Code available for payment source | Source: Ambulatory Visit | Attending: Pain Medicine | Admitting: Pain Medicine

## 2023-07-11 DIAGNOSIS — Z1231 Encounter for screening mammogram for malignant neoplasm of breast: Secondary | ICD-10-CM

## 2023-09-27 DIAGNOSIS — Z1211 Encounter for screening for malignant neoplasm of colon: Secondary | ICD-10-CM | POA: Diagnosis not present

## 2023-09-27 DIAGNOSIS — K5904 Chronic idiopathic constipation: Secondary | ICD-10-CM | POA: Diagnosis not present

## 2023-09-27 DIAGNOSIS — K219 Gastro-esophageal reflux disease without esophagitis: Secondary | ICD-10-CM | POA: Diagnosis not present

## 2023-10-13 ENCOUNTER — Inpatient Hospital Stay: Admission: RE | Admit: 2023-10-13 | Payer: 59 | Source: Ambulatory Visit

## 2023-10-18 ENCOUNTER — Other Ambulatory Visit: Payer: Self-pay | Admitting: Pain Medicine

## 2023-10-18 DIAGNOSIS — Z780 Asymptomatic menopausal state: Secondary | ICD-10-CM

## 2023-10-19 DIAGNOSIS — Z860101 Personal history of adenomatous and serrated colon polyps: Secondary | ICD-10-CM | POA: Diagnosis not present

## 2023-10-19 DIAGNOSIS — Z1211 Encounter for screening for malignant neoplasm of colon: Secondary | ICD-10-CM | POA: Diagnosis not present

## 2023-10-19 DIAGNOSIS — K2101 Gastro-esophageal reflux disease with esophagitis, with bleeding: Secondary | ICD-10-CM | POA: Diagnosis not present

## 2023-10-19 DIAGNOSIS — R12 Heartburn: Secondary | ICD-10-CM | POA: Diagnosis not present

## 2023-10-19 DIAGNOSIS — K449 Diaphragmatic hernia without obstruction or gangrene: Secondary | ICD-10-CM | POA: Diagnosis not present

## 2024-03-06 DIAGNOSIS — H1033 Unspecified acute conjunctivitis, bilateral: Secondary | ICD-10-CM | POA: Diagnosis not present

## 2024-04-01 DIAGNOSIS — L811 Chloasma: Secondary | ICD-10-CM | POA: Diagnosis not present

## 2024-04-01 DIAGNOSIS — K219 Gastro-esophageal reflux disease without esophagitis: Secondary | ICD-10-CM | POA: Diagnosis not present

## 2024-04-01 DIAGNOSIS — G4700 Insomnia, unspecified: Secondary | ICD-10-CM | POA: Diagnosis not present

## 2024-04-01 DIAGNOSIS — E782 Mixed hyperlipidemia: Secondary | ICD-10-CM | POA: Diagnosis not present

## 2024-04-01 DIAGNOSIS — Z0000 Encounter for general adult medical examination without abnormal findings: Secondary | ICD-10-CM | POA: Diagnosis not present

## 2024-06-10 ENCOUNTER — Other Ambulatory Visit: Payer: No Typology Code available for payment source

## 2024-08-30 ENCOUNTER — Ambulatory Visit
Admission: RE | Admit: 2024-08-30 | Discharge: 2024-08-30 | Disposition: A | Source: Ambulatory Visit | Attending: Family Medicine | Admitting: Family Medicine

## 2024-08-30 ENCOUNTER — Other Ambulatory Visit: Payer: Self-pay | Admitting: Family Medicine

## 2024-08-30 DIAGNOSIS — Z1231 Encounter for screening mammogram for malignant neoplasm of breast: Secondary | ICD-10-CM
